# Patient Record
Sex: Female | Born: 1995 | Race: Black or African American | Hispanic: No | Marital: Single | Smoking: Never smoker
Health system: Southern US, Community
[De-identification: ages and names within clinical notes are randomized; demographics above are authoritative.]

## PROBLEM LIST (undated history)

## (undated) ENCOUNTER — Ambulatory Visit (HOSPITAL_COMMUNITY): Admission: EM | Discharge status: Absent without leave | Payer: No Typology Code available for payment source

## (undated) DIAGNOSIS — B9689 Other specified bacterial agents as the cause of diseases classified elsewhere: Secondary | ICD-10-CM

## (undated) DIAGNOSIS — N83209 Unspecified ovarian cyst, unspecified side: Secondary | ICD-10-CM

## (undated) HISTORY — PX: NO PAST SURGERIES: SHX2092

---
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
## 2014-02-22 ENCOUNTER — Emergency Department (HOSPITAL_COMMUNITY): Payer: BC Managed Care – PPO

## 2014-02-22 ENCOUNTER — Emergency Department (HOSPITAL_COMMUNITY)
Admission: EM | Admit: 2014-02-22 | Discharge: 2014-02-22 | Disposition: A | Discharge status: Home / Self Care | Payer: BC Managed Care – PPO | Attending: Emergency Medicine | Admitting: Emergency Medicine

## 2014-02-22 ENCOUNTER — Encounter (HOSPITAL_COMMUNITY): Payer: Self-pay | Admitting: Emergency Medicine

## 2014-02-22 DIAGNOSIS — R079 Chest pain, unspecified: Secondary | ICD-10-CM | POA: Diagnosis present

## 2014-02-22 DIAGNOSIS — R339 Retention of urine, unspecified: Secondary | ICD-10-CM

## 2014-02-22 DIAGNOSIS — F17200 Nicotine dependence, unspecified, uncomplicated: Secondary | ICD-10-CM | POA: Insufficient documentation

## 2014-02-22 DIAGNOSIS — R112 Nausea with vomiting, unspecified: Secondary | ICD-10-CM

## 2014-02-22 DIAGNOSIS — Z792 Long term (current) use of antibiotics: Secondary | ICD-10-CM | POA: Insufficient documentation

## 2014-02-22 DIAGNOSIS — N390 Urinary tract infection, site not specified: Secondary | ICD-10-CM | POA: Diagnosis not present

## 2014-02-22 DIAGNOSIS — R0789 Other chest pain: Secondary | ICD-10-CM

## 2014-02-22 DIAGNOSIS — Z3202 Encounter for pregnancy test, result negative: Secondary | ICD-10-CM | POA: Diagnosis not present

## 2014-02-22 LAB — COMPREHENSIVE METABOLIC PANEL
ALT: 15 U/L (ref 0–35)
AST: 22 U/L (ref 0–37)
Albumin: 4.4 g/dL (ref 3.5–5.2)
Alkaline Phosphatase: 73 U/L (ref 39–117)
BILIRUBIN TOTAL: 0.4 mg/dL (ref 0.3–1.2)
BUN: 9 mg/dL (ref 6–23)
CHLORIDE: 101 meq/L (ref 96–112)
CO2: 20 meq/L (ref 19–32)
CREATININE: 0.66 mg/dL (ref 0.50–1.10)
Calcium: 10 mg/dL (ref 8.4–10.5)
GFR calc Af Amer: >90 mL/min (ref >90)
Glucose, Bld: 123 mg/dL — ABNORMAL HIGH (ref 70–99)
Potassium: 4.8 mEq/L (ref 3.7–5.3)
Sodium: 138 mEq/L (ref 137–147)
Total Protein: 8.4 g/dL — ABNORMAL HIGH (ref 6.0–8.3)

## 2014-02-22 LAB — CBC WITH DIFFERENTIAL/PLATELET
BASOS ABS: 0 10*3/uL (ref 0.0–0.1)
Basophils Relative: 0 % (ref 0–1)
Eosinophils Absolute: 0 10*3/uL (ref 0.0–0.7)
Eosinophils Relative: 0 % (ref 0–5)
HEMATOCRIT: 42.1 % (ref 36.0–46.0)
HEMOGLOBIN: 14.1 g/dL (ref 12.0–15.0)
LYMPHS ABS: 1.4 10*3/uL (ref 0.7–4.0)
LYMPHS PCT: 15 % (ref 12–46)
MCH: 25.3 pg — ABNORMAL LOW (ref 26.0–34.0)
MCHC: 33.5 g/dL (ref 30.0–36.0)
MCV: 75.4 fL — ABNORMAL LOW (ref 78.0–100.0)
MONO ABS: 0.2 10*3/uL (ref 0.1–1.0)
MONOS PCT: 2 % — AB (ref 3–12)
NEUTROS ABS: 7.7 10*3/uL (ref 1.7–7.7)
Neutrophils Relative %: 83 % — ABNORMAL HIGH (ref 43–77)
Platelets: 270 10*3/uL (ref 150–400)
RBC: 5.58 MIL/uL — AB (ref 3.87–5.11)
RDW: 13.9 % (ref 11.5–15.5)
WBC: 9.4 10*3/uL (ref 4.0–10.5)

## 2014-02-22 LAB — URINALYSIS, ROUTINE W REFLEX MICROSCOPIC
Bilirubin Urine: NEGATIVE
Bilirubin Urine: NEGATIVE
GLUCOSE, UA: NEGATIVE mg/dL
GLUCOSE, UA: NEGATIVE mg/dL
KETONES UR: 15 mg/dL — AB
Ketones, ur: 40 mg/dL — AB
Nitrite: NEGATIVE
Nitrite: NEGATIVE
PROTEIN: 100 mg/dL — AB
Protein, ur: 100 mg/dL — AB
SPECIFIC GRAVITY, URINE: 1.025 (ref 1.005–1.030)
Specific Gravity, Urine: 1.023 (ref 1.005–1.030)
Urobilinogen, UA: 1 mg/dL (ref 0.0–1.0)
Urobilinogen, UA: 1 mg/dL (ref 0.0–1.0)
pH: 8.5 — ABNORMAL HIGH (ref 5.0–8.0)
pH: 8.5 — ABNORMAL HIGH (ref 5.0–8.0)

## 2014-02-22 LAB — URINE MICROSCOPIC-ADD ON

## 2014-02-22 LAB — LIPASE, BLOOD: Lipase: 16 U/L (ref 11–59)

## 2014-02-22 LAB — PREGNANCY, URINE: Preg Test, Ur: NEGATIVE

## 2014-02-22 MED ORDER — ONDANSETRON HCL 4 MG/2ML IJ SOLN
4.0000 mg | Freq: Once | INTRAMUSCULAR | Status: AC
  Administered 2014-02-22: 4 mg via INTRAVENOUS
  Filled 2014-02-22: qty 2

## 2014-02-22 MED ORDER — SODIUM CHLORIDE 0.9 % IV BOLUS (SEPSIS)
1000.0000 mL | Freq: Once | INTRAVENOUS | Status: AC
  Administered 2014-02-22: 1000 mL via INTRAVENOUS

## 2014-02-22 MED ORDER — DEXTROSE 5 % IV SOLN
1.0000 g | Freq: Once | INTRAVENOUS | Status: AC
  Administered 2014-02-22: 1 g via INTRAVENOUS
  Filled 2014-02-22: qty 10

## 2014-02-22 MED ORDER — OXYCODONE-ACETAMINOPHEN 5-325 MG PO TABS
1.0000 | ORAL_TABLET | Freq: Once | ORAL | Status: AC
  Administered 2014-02-22: 1 via ORAL
  Filled 2014-02-22: qty 1

## 2014-02-22 MED ORDER — CEPHALEXIN 500 MG PO CAPS: 500.0000 mg | ORAL_CAPSULE | Freq: Four times a day (QID) | ORAL | Status: DC

## 2014-02-22 MED ORDER — FENTANYL CITRATE 0.05 MG/ML IJ SOLN
100.0000 ug | Freq: Once | INTRAMUSCULAR | Status: AC
  Administered 2014-02-22: 100 ug via INTRAVENOUS
  Filled 2014-02-22: qty 2

## 2014-02-22 MED ORDER — SODIUM CHLORIDE 0.9 % IV BOLUS (SEPSIS)
500.0000 mL | Freq: Once | INTRAVENOUS | Status: AC
  Administered 2014-02-22: 500 mL via INTRAVENOUS

## 2014-02-22 MED ORDER — ONDANSETRON 4 MG PO TBDP
8.0000 mg | ORAL_TABLET | Freq: Once | ORAL | Status: AC
  Administered 2014-02-22: 8 mg via ORAL
  Filled 2014-02-22: qty 2

## 2014-02-22 NOTE — ED Notes (Signed)
The pt was seen here earlier today  And diagnosed with a uti and she was having nv and chest pain which sh still has at present.  She has not beenable to hold her meds down since she got home,  lmp  May 30

## 2014-02-22 NOTE — ED Provider Notes (Signed)
Medical screening examination/treatment/procedure(s) were performed by non-physician practitioner and as supervising physician I was immediately available for consultation/collaboration.   EKG Interpretation   Date/Time:  Thursday February 22 2014 11:32:29 EDT Ventricular Rate:  80 PR Interval:  118 QRS Duration: 79 QT Interval:  354 QTC Calculation: 408 R Axis:   61 Text Interpretation:  Sinus rhythm Borderline short PR interval Confirmed  by WARD,  DO, KRISTEN [phone]) on 02/22/2014 11:36:48 AM        Layla Maw Ward, DO 02/22/14 [id number]

## 2014-02-22 NOTE — ED Provider Notes (Signed)
CSN: [id number]     Arrival date & time 02/22/14  1115 History   First MD Initiated Contact with Patient 02/22/14 1117     Chief Complaint  Patient presents with  . Chest Pain     (Consider location/radiation/quality/duration/timing/severity/associated sxs/prior Treatment) HPI  Patient to the ER with complaints of not feeling well, chest pains, abdominal pain and vomiting. The symptoms started yesterday with RLQ abdominal pains and vomiting but has since resolved. She also complains of some pressure during urination. Then today she developed pain in her chest, she is unable to describe it more than a pressure feeling all over and that the pain is severe. Patient looks as though she does not feel well and talks with her eyes closed. She has normal vital signs and her EKG is also normal.  She reports NO pain at all today in her abdomen. No diarrhea, SOB, cough, vaginal bleeding or discharge. LMP was last month.  History reviewed. No pertinent past medical history. History reviewed. No pertinent past surgical history. No family history on file. History  Substance Use Topics  . Smoking status: Current Some Day Smoker    Types: Cigarettes  . Smokeless tobacco: Not on file  . Alcohol Use: No   OB History   Grav Para Term Preterm Abortions TAB SAB Ect Mult Living                 Review of Systems  Review of Systems  Gen: no weight loss, fevers, chills, night sweats  Eyes: no discharge or drainage, no occular pain or visual changes  Nose: no epistaxis or rhinorrhea  Mouth: no dental pain, no sore throat  Neck: no neck pain  Lungs:No wheezing, coughing or hemoptysis CV: + chest pain,No  palpitations, dependent edema or orthopnea  Abd: + abdominal pain, nausea, vomiting, No diarrhea GU: no dysuria or gross hematuria  MSK:  No muscle weakness or pain Neuro: no headache, no focal neurologic deficits  Skin: no rash or wounds Psyche: no complaints     Allergies  Review of  patient's allergies indicates no known allergies.  Home Medications   Prior to Admission medications   Medication Sig Start Date End Date Taking? Authorizing Provider  ibuprofen (ADVIL,MOTRIN) 200 MG tablet Take 400 mg by mouth every 6 (six) hours as needed.   Yes Historical Provider, MD  cephALEXin (KEFLEX) 500 MG capsule Take 1 capsule (500 mg total) by mouth 4 (four) times daily. 02/22/14   Olancha Ducre Irine Seal, PA-C   BP 107/58  Pulse 79  Temp(Src) 98.5 F (36.9 C) (Oral)  Resp 99  Ht 5\' 4"  (1.626 m)  Wt 145 lb (65.772 kg)  BMI 24.88 kg/m2  SpO2 100%  LMP 01/22/2014 Physical Exam  Nursing note and vitals reviewed. Constitutional: She appears well-developed and well-nourished. No distress.  HENT:  Head: Normocephalic and atraumatic.  Eyes: Pupils are equal, round, and reactive to light.  Neck: Normal range of motion. Neck supple.  Cardiovascular: Normal rate and regular rhythm.   Pulmonary/Chest: Effort normal and breath sounds normal. She has no decreased breath sounds. She has no wheezes. She has no rhonchi.  No tenderness to chest wall.  Abdominal: Soft. Bowel sounds are normal. She exhibits no distension. There is no tenderness. There is no rigidity, no guarding, no CVA tenderness and negative Murphy's sign.  No tenderness to the abdomen.  Neurological: She is alert.  Skin: Skin is warm and dry.      ED Course  Procedures (  including critical care time) Labs Review Labs Reviewed  CBC WITH DIFFERENTIAL - Abnormal; Notable for the following:    RBC 5.58 (*)    MCV 75.4 (*)    MCH 25.3 (*)    Neutrophils Relative % 83 (*)    Monocytes Relative 2 (*)    All other components within normal limits  COMPREHENSIVE METABOLIC PANEL - Abnormal; Notable for the following:    Glucose, Bld 123 (*)    Total Protein 8.4 (*)    All other components within normal limits  URINALYSIS, ROUTINE W REFLEX MICROSCOPIC - Abnormal; Notable for the following:    APPearance CLOUDY (*)    pH  8.5 (*)    Hgb urine dipstick MODERATE (*)    Ketones, ur 15 (*)    Protein, ur 100 (*)    Leukocytes, UA LARGE (*)    All other components within normal limits  URINE MICROSCOPIC-ADD ON - Abnormal; Notable for the following:    Squamous Epithelial / LPF MANY (*)    Bacteria, UA MANY (*)    All other components within normal limits  URINALYSIS, ROUTINE W REFLEX MICROSCOPIC - Abnormal; Notable for the following:    APPearance CLOUDY (*)    pH 8.5 (*)    Hgb urine dipstick MODERATE (*)    Ketones, ur 40 (*)    Protein, ur 100 (*)    Leukocytes, UA LARGE (*)    All other components within normal limits  URINE MICROSCOPIC-ADD ON - Abnormal; Notable for the following:    Squamous Epithelial / LPF MANY (*)    Bacteria, UA MANY (*)    All other components within normal limits  URINE CULTURE  LIPASE, BLOOD  PREGNANCY, URINE    Imaging Review Dg Chest 2 View  02/22/2014   CLINICAL DATA:  Chest pain.  EXAM: CHEST  2 VIEW  COMPARISON:  07/02/2012.  FINDINGS: The heart size and mediastinal contours are within normal limits. Both lungs are clear. The visualized skeletal structures are unremarkable.  IMPRESSION: Normal chest x-ray.   Electronically Signed   By: Loralie Champagne M.D.   On: 02/22/2014 12:43     EKG Interpretation   Date/Time:  Thursday February 22 2014 11:32:29 EDT Ventricular Rate:  80 PR Interval:  118 QRS Duration: 79 QT Interval:  354 QTC Calculation: 408 R Axis:   61 Text Interpretation:  Sinus rhythm Borderline short PR interval Confirmed  by WARD,  DO, KRISTEN ([id number]) on 02/22/2014 11:36:48 AM      MDM   Final diagnoses:  UTI (lower urinary tract infection)    The patients urine has come back with a pH of 8.5, ketones, moderate hemoglobin and large leukocytes. Microscopic showed many squamous cells and bacteria. Due to her having some abdominal pain earlier and some vague other pain symptoms I have asked the nurse to repeat her urinalysis and give education on  clean-catch.  Patient given 1 L of saline and some pain medications.  1:39 pm - Urine is still pending but patient looks much much better. Now that she has had a liter of fluids she is no longer having pain and no longer feels tired. Her mom is now present and admits that she hasnt been eating and drinking as much because she wasn't feeling well, this caused her to get worse.   2:53 pm- patient received 1 gram Iv Rocephin due to the fact that she was sleepy, had abd pain and vomiting yesterday. Will switch to oral  keflex, Rx for Keflex written to treat for UTI. She can follow-up with her PCP.  The urine culture has been added on.  17 y.o.Kayla Massey's evaluation in the Emergency Department is complete. It has been determined that no acute conditions requiring further emergency intervention are present at this time. The patient/guardian have been advised of the diagnosis and plan. We have discussed signs and symptoms that warrant return to the ED, such as changes or worsening in symptoms.  Vital signs are stable at discharge. Filed Vitals:   02/22/14 1322  BP: 107/58  Pulse:   Temp: 98.5 F (36.9 C)  Resp: 99    Patient/guardian has voiced understanding and agreed to follow-up with the PCP or specialist.   Dorthula Matasiffany G Destin Kittler, PA-C 02/22/14 1455

## 2014-02-22 NOTE — Discharge Instructions (Signed)
Urinary Tract Infection  Urinary tract infections (UTIs) can develop anywhere along your urinary tract. Your urinary tract is your body's drainage system for removing wastes and extra water. Your urinary tract includes two kidneys, two ureters, a bladder, and a urethra. Your kidneys are a pair of bean-shaped organs. Each kidney is about the size of your fist. They are located below your ribs, one on each side of your spine.  CAUSES  Infections are caused by microbes, which are microscopic organisms, including fungi, viruses, and bacteria. These organisms are so small that they can only be seen through a microscope. Bacteria are the microbes that most commonly cause UTIs.  SYMPTOMS   Symptoms of UTIs may vary by age and gender of the patient and by the location of the infection. Symptoms in young women typically include a frequent and intense urge to urinate and a painful, burning feeling in the bladder or urethra during urination. Older women and men are more likely to be tired, shaky, and weak and have muscle aches and abdominal pain. A fever may mean the infection is in your kidneys. Other symptoms of a kidney infection include pain in your back or sides below the ribs, nausea, and vomiting.  DIAGNOSIS  To diagnose a UTI, your caregiver will ask you about your symptoms. Your caregiver also will ask to provide a urine sample. The urine sample will be tested for bacteria and white blood cells. White blood cells are made by your body to help fight infection.  TREATMENT   Typically, UTIs can be treated with medication. Because most UTIs are caused by a bacterial infection, they usually can be treated with the use of antibiotics. The choice of antibiotic and length of treatment depend on your symptoms and the type of bacteria causing your infection.  HOME CARE INSTRUCTIONS   If you were prescribed antibiotics, take them exactly as your caregiver instructs you. Finish the medication even if you feel better after you  have only taken some of the medication.   Drink enough water and fluids to keep your urine clear or pale yellow.   Avoid caffeine, tea, and carbonated beverages. They tend to irritate your bladder.   Empty your bladder often. Avoid holding urine for long periods of time.   Empty your bladder before and after sexual intercourse.   After a bowel movement, women should cleanse from front to back. Use each tissue only once.  SEEK MEDICAL CARE IF:    You have back pain.   You develop a fever.   Your symptoms do not begin to resolve within 3 days.  SEEK IMMEDIATE MEDICAL CARE IF:    You have severe back pain or lower abdominal pain.   You develop chills.   You have nausea or vomiting.   You have continued burning or discomfort with urination.  MAKE SURE YOU:    Understand these instructions.   Will watch your condition.   Will get help right away if you are not doing well or get worse.  Document Released: 06/10/2005 Document Revised: 03/01/2012 Document Reviewed: 10/09/2011  [employer] Patient Information 2014 [employer]

## 2014-02-23 ENCOUNTER — Emergency Department (HOSPITAL_COMMUNITY)
Admission: EM | Admit: 2014-02-23 | Discharge: 2014-02-23 | Disposition: A | Discharge status: Home / Self Care | Payer: BC Managed Care – PPO | Source: Home / Self Care | Attending: Emergency Medicine | Admitting: Emergency Medicine

## 2014-02-23 DIAGNOSIS — R0789 Other chest pain: Secondary | ICD-10-CM

## 2014-02-23 DIAGNOSIS — R112 Nausea with vomiting, unspecified: Secondary | ICD-10-CM

## 2014-02-23 DIAGNOSIS — R339 Retention of urine, unspecified: Secondary | ICD-10-CM

## 2014-02-23 DIAGNOSIS — N390 Urinary tract infection, site not specified: Secondary | ICD-10-CM | POA: Diagnosis not present

## 2014-02-23 LAB — URINALYSIS, ROUTINE W REFLEX MICROSCOPIC
BILIRUBIN URINE: NEGATIVE
Glucose, UA: NEGATIVE mg/dL
Ketones, ur: >80 mg/dL — AB
NITRITE: NEGATIVE
PROTEIN: 100 mg/dL — AB
SPECIFIC GRAVITY, URINE: 1.027 (ref 1.005–1.030)
UROBILINOGEN UA: 1 mg/dL (ref 0.0–1.0)
pH: 7 (ref 5.0–8.0)

## 2014-02-23 LAB — CBC WITH DIFFERENTIAL/PLATELET
BASOS ABS: 0 10*3/uL (ref 0.0–0.1)
BASOS PCT: 0 % (ref 0–1)
EOS ABS: 0 10*3/uL (ref 0.0–0.7)
Eosinophils Relative: 0 % (ref 0–5)
HEMATOCRIT: 40.1 % (ref 36.0–46.0)
Hemoglobin: 13.1 g/dL (ref 12.0–15.0)
Lymphocytes Relative: 20 % (ref 12–46)
Lymphs Abs: 1.9 10*3/uL (ref 0.7–4.0)
MCH: 24.9 pg — AB (ref 26.0–34.0)
MCHC: 32.7 g/dL (ref 30.0–36.0)
MCV: 76.2 fL — ABNORMAL LOW (ref 78.0–100.0)
MONO ABS: 0.6 10*3/uL (ref 0.1–1.0)
Monocytes Relative: 6 % (ref 3–12)
NEUTROS ABS: 7 10*3/uL (ref 1.7–7.7)
NEUTROS PCT: 74 % (ref 43–77)
Platelets: 248 10*3/uL (ref 150–400)
RBC: 5.26 MIL/uL — ABNORMAL HIGH (ref 3.87–5.11)
RDW: 14.1 % (ref 11.5–15.5)
WBC: 9.6 10*3/uL (ref 4.0–10.5)

## 2014-02-23 LAB — URINE MICROSCOPIC-ADD ON

## 2014-02-23 MED ORDER — ONDANSETRON HCL 4 MG PO TABS: 4.0000 mg | ORAL_TABLET | Freq: Four times a day (QID) | ORAL | Status: DC | PRN

## 2014-02-23 MED ORDER — SODIUM CHLORIDE 0.9 % IV SOLN
1000.0000 mL | Freq: Once | INTRAVENOUS | Status: AC
  Administered 2014-02-23: 1000 mL via INTRAVENOUS

## 2014-02-23 MED ORDER — ONDANSETRON HCL 4 MG/2ML IJ SOLN
4.0000 mg | Freq: Once | INTRAMUSCULAR | Status: AC
  Administered 2014-02-23: 4 mg via INTRAVENOUS
  Filled 2014-02-23: qty 2

## 2014-02-23 MED ORDER — SODIUM CHLORIDE 0.9 % IV SOLN: 1000.0000 mL | INTRAVENOUS | Status: DC

## 2014-02-23 MED ORDER — KETOROLAC TROMETHAMINE 30 MG/ML IJ SOLN
30.0000 mg | Freq: Once | INTRAMUSCULAR | Status: AC
  Administered 2014-02-23: 30 mg via INTRAVENOUS
  Filled 2014-02-23: qty 1

## 2014-02-23 NOTE — ED Provider Notes (Signed)
CSN: [id number]     Arrival date & time 02/22/14  2105 History   First MD Initiated Contact with Patient 02/23/14 0342     Chief Complaint  Patient presents with  . diagnosed with uti earlier today      (Consider location/radiation/quality/duration/timing/severity/associated sxs/prior Treatment) The history is provided by the patient.   18 year old female had been in the ED yesterday and diagnosed with UTI. She presented with pain which was in her back and chest. She states pain was sharp and she rated pain at 10/10. She was seen in the ED and discharged with prescription for cephalexin. There was associated nausea and vomiting. She continued to have nausea and vomiting at home and was unable to get cephalexin down. Dyes she comes in with ongoing problems with pain in her chest. She denies fever, chills, sweats. She denies dysuria or urinary urgency or frequency. She had been given a dose of oxycodone-acetaminophen at triage and chest pain has resolved. She was also given a dose of ondansetron but she continues to have some nausea.  History reviewed. No pertinent past medical history. History reviewed. No pertinent past surgical history. No family history on file. History  Substance Use Topics  . Smoking status: Current Some Day Smoker    Types: Cigarettes  . Smokeless tobacco: Not on file  . Alcohol Use: No   OB History   Grav Para Term Preterm Abortions TAB SAB Ect Mult Living                 Review of Systems  All other systems reviewed and are negative.     Allergies  Review of patient's allergies indicates no known allergies.  Home Medications   Prior to Admission medications   Medication Sig Start Date End Date Taking? Authorizing Provider  cephALEXin (KEFLEX) 500 MG capsule Take 1 capsule (500 mg total) by mouth 4 (four) times daily. 02/22/14  Yes Tiffany Irine SealG Greene, PA-C  ibuprofen (ADVIL,MOTRIN) 200 MG tablet Take 400 mg by mouth every 6 (six) hours as needed.   Yes  Historical Provider, MD   BP 96/61  Pulse 68  Temp(Src) 99.4 F (37.4 C) (Oral)  Resp 18  Ht 5\' 4"  (1.626 m)  Wt 145 lb (65.772 kg)  BMI 24.88 kg/m2  SpO2 98%  LMP 01/22/2014 Physical Exam  Nursing note and vitals reviewed.  18 year old female, resting comfortably and in no acute distress. Vital signs are normal. Oxygen saturation is 98%, which is normal. Head is normocephalic and atraumatic. PERRLA, EOMI. Oropharynx is clear. Neck is nontender and supple without adenopathy or JVD. Back is nontender and there is no CVA tenderness. Lungs are clear without rales, wheezes, or rhonchi. Chest is nontender. Heart has regular rate and rhythm without murmur. Abdomen is soft, flat, nontender without masses or hepatosplenomegaly and peristalsis is hypoactive. Extremities have no cyanosis or edema, full range of motion is present. Skin is warm and dry without rash. Neurologic: Mental status is normal, cranial nerves are intact, there are no motor or sensory deficits.  ED Course  Procedures (including critical care time) Labs Review Results for orders placed during the hospital encounter of 02/23/14  CBC WITH DIFFERENTIAL      Result Value Ref Range   WBC 9.6  4.0 - 10.5 K/uL   RBC 5.26 (*) 3.87 - 5.11 MIL/uL   Hemoglobin 13.1  12.0 - 15.0 g/dL   HCT 40.940.1  81.136.0 - 91.446.0 %   MCV 76.2 (*) 78.0 -  100.0 fL   MCH 24.9 (*) 26.0 - 34.0 pg   MCHC 32.7  30.0 - 36.0 g/dL   RDW 16.114.1  09.611.5 - 04.515.5 %   Platelets 248  150 - 400 K/uL   Neutrophils Relative % 74  43 - 77 %   Neutro Abs 7.0  1.7 - 7.7 K/uL   Lymphocytes Relative 20  12 - 46 %   Lymphs Abs 1.9  0.7 - 4.0 K/uL   Monocytes Relative 6  3 - 12 %   Monocytes Absolute 0.6  0.1 - 1.0 K/uL   Eosinophils Relative 0  0 - 5 %   Eosinophils Absolute 0.0  0.0 - 0.7 K/uL   Basophils Relative 0  0 - 1 %   Basophils Absolute 0.0  0.0 - 0.1 K/uL  URINALYSIS, ROUTINE W REFLEX MICROSCOPIC      Result Value Ref Range   Color, Urine YELLOW  YELLOW    APPearance TURBID (*) CLEAR   Specific Gravity, Urine 1.027  1.005 - 1.030   pH 7.0  5.0 - 8.0   Glucose, UA NEGATIVE  NEGATIVE mg/dL   Hgb urine dipstick LARGE (*) NEGATIVE   Bilirubin Urine NEGATIVE  NEGATIVE   Ketones, ur >80 (*) NEGATIVE mg/dL   Protein, ur 409100 (*) NEGATIVE mg/dL   Urobilinogen, UA 1.0  0.0 - 1.0 mg/dL   Nitrite NEGATIVE  NEGATIVE   Leukocytes, UA LARGE (*) NEGATIVE  URINE MICROSCOPIC-ADD ON      Result Value Ref Range   Squamous Epithelial / LPF RARE  RARE   WBC, UA TOO NUMEROUS TO COUNT  <3 WBC/hpf   RBC / HPF TOO NUMEROUS TO COUNT  <3 RBC/hpf   Bacteria, UA FEW (*) RARE    MDM   Final diagnoses:  Urinary retention with incomplete bladder emptying  Nausea & vomiting  Non-cardiac chest pain    Vague chest pain and nausea. Pain is resolved and he has been more than 5 hours since her dose of oxycodone acetaminophen so I do not feel that pain is has been asked. She continues to have nausea. Old records are reviewed and she had 2 urinalyses done, both of which were contaminated. She was given a dose of ceftriaxone so she has received adequate treatment for a UTI. She will be given intravenous ondansetron and dose of ketorolac. I am not all that certain that she actually has a UTI.  Catheterized urinalysis she confirms presence of UTI with too numerous to count WBCs and too numerous to count RBCs. WBC has remained stable. She feels much better after IV fluids, ondansetron, and ketorolac. She is discharged with prescription for ondansetron and is advised to use either ibuprofen or naproxen as needed for pain. She is to continue taking cephalexin. She will need to follow up with PCP in 10 days to make sure that urine has cleared.  Dione Boozeavid Saarah Dewing, MD 02/23/14 [phone]

## 2014-02-23 NOTE — Discharge Instructions (Signed)
Continue taking the antibiotic. Take Ibuprofen or Naproxen as needed for pain.  Urinary Tract Infection Urinary tract infections (UTIs) can develop anywhere along your urinary tract. Your urinary tract is your body's drainage system for removing wastes and extra water. Your urinary tract includes two kidneys, two ureters, a bladder, and a urethra. Your kidneys are a pair of bean-shaped organs. Each kidney is about the size of your fist. They are located below your ribs, one on each side of your spine. CAUSES Infections are caused by microbes, which are microscopic organisms, including fungi, viruses, and bacteria. These organisms are so small that they can only be seen through a microscope. Bacteria are the microbes that most commonly cause UTIs. SYMPTOMS  Symptoms of UTIs may vary by age and gender of the patient and by the location of the infection. Symptoms in young women typically include a frequent and intense urge to urinate and a painful, burning feeling in the bladder or urethra during urination. Older women and men are more likely to be tired, shaky, and weak and have muscle aches and abdominal pain. A fever may mean the infection is in your kidneys. Other symptoms of a kidney infection include pain in your back or sides below the ribs, nausea, and vomiting. DIAGNOSIS To diagnose a UTI, your caregiver will ask you about your symptoms. Your caregiver also will ask to provide a urine sample. The urine sample will be tested for bacteria and white blood cells. White blood cells are made by your body to help fight infection. TREATMENT  Typically, UTIs can be treated with medication. Because most UTIs are caused by a bacterial infection, they usually can be treated with the use of antibiotics. The choice of antibiotic and length of treatment depend on your symptoms and the type of bacteria causing your infection. HOME CARE INSTRUCTIONS  If you were prescribed antibiotics, take them exactly as your  caregiver instructs you. Finish the medication even if you feel better after you have only taken some of the medication.  Drink enough water and fluids to keep your urine clear or pale yellow.  Avoid caffeine, tea, and carbonated beverages. They tend to irritate your bladder.  Empty your bladder often. Avoid holding urine for long periods of time.  Empty your bladder before and after sexual intercourse.  After a bowel movement, women should cleanse from front to back. Use each tissue only once. SEEK MEDICAL CARE IF:   You have back pain.  You develop a fever.  Your symptoms do not begin to resolve within 3 days. SEEK IMMEDIATE MEDICAL CARE IF:   You have severe back pain or lower abdominal pain.  You develop chills.  You have nausea or vomiting.  You have continued burning or discomfort with urination. MAKE SURE YOU:   Understand these instructions.  Will watch your condition.  Will get help right away if you are not doing well or get worse. Document Released: 06/10/2005 Document Revised: 03/01/2012 Document Reviewed: 10/09/2011 [facility] Patient Information 2014 [employer], [place].  Nausea and Vomiting Nausea is a sick feeling that often comes before throwing up (vomiting). Vomiting is a reflex where stomach contents come out of your mouth. Vomiting can cause severe loss of body fluids (dehydration). Children and elderly adults can become dehydrated quickly, especially if they also have diarrhea. Nausea and vomiting are symptoms of a condition or disease. It is important to find the cause of your symptoms. CAUSES   Direct irritation of the stomach lining. This irritation can  result from increased acid production (gastroesophageal reflux disease), infection, food poisoning, taking certain medicines (such as nonsteroidal anti-inflammatory drugs), alcohol use, or tobacco use.  Signals from the brain.These signals could be caused by a headache, heat exposure, an inner ear  disturbance, increased pressure in the brain from injury, infection, a tumor, or a concussion, pain, emotional stimulus, or metabolic problems.  An obstruction in the gastrointestinal tract (bowel obstruction).  Illnesses such as diabetes, hepatitis, gallbladder problems, appendicitis, kidney problems, cancer, sepsis, atypical symptoms of a heart attack, or eating disorders.  Medical treatments such as chemotherapy and radiation.  Receiving medicine that makes you sleep (general anesthetic) during surgery. DIAGNOSIS Your caregiver may ask for tests to be done if the problems do not improve after a few days. Tests may also be done if symptoms are severe or if the reason for the nausea and vomiting is not clear. Tests may include:  Urine tests.  Blood tests.  Stool tests.  Cultures (to look for evidence of infection).  X-rays or other imaging studies. Test results can help your caregiver make decisions about treatment or the need for additional tests. TREATMENT You need to stay well hydrated. Drink frequently but in small amounts.You may wish to drink water, sports drinks, clear broth, or eat frozen ice pops or gelatin dessert to help stay hydrated.When you eat, eating slowly may help prevent nausea.There are also some antinausea medicines that may help prevent nausea. HOME CARE INSTRUCTIONS   Take all medicine as directed by your caregiver.  If you do not have an appetite, do not force yourself to eat. However, you must continue to drink fluids.  If you have an appetite, eat a normal diet unless your caregiver tells you differently.  Eat a variety of complex carbohydrates (rice, wheat, potatoes, bread), lean meats, yogurt, fruits, and vegetables.  Avoid high-fat foods because they are more difficult to digest.  Drink enough water and fluids to keep your urine clear or pale yellow.  If you are dehydrated, ask your caregiver for specific rehydration instructions. Signs of  dehydration may include:  Severe thirst.  Dry lips and mouth.  Dizziness.  Dark urine.  Decreasing urine frequency and amount.  Confusion.  Rapid breathing or pulse. SEEK IMMEDIATE MEDICAL CARE IF:   You have blood or brown flecks (like coffee grounds) in your vomit.  You have black or bloody stools.  You have a severe headache or stiff neck.  You are confused.  You have severe abdominal pain.  You have chest pain or trouble breathing.  You do not urinate at least once every 8 hours.  You develop cold or clammy skin.  You continue to vomit for longer than 24 to 48 hours.  You have a fever. MAKE SURE YOU:   Understand these instructions.  Will watch your condition.  Will get help right away if you are not doing well or get worse. Document Released: 08/31/2005 Document Revised: 11/23/2011 Document Reviewed: 01/28/2011 [facility] Patient Information 2014 [place], [place].  Ondansetron tablets What is this medicine? ONDANSETRON (on DAN se tron) is used to treat nausea and vomiting caused by chemotherapy. It is also used to prevent or treat nausea and vomiting after surgery. This medicine may be used for other purposes; ask your health care provider or pharmacist if you have questions. COMMON BRAND NAME(S): Zofran What should I tell my health care provider before I take this medicine? They need to know if you have any of these conditions: -heart disease -history of  irregular heartbeat -liver disease -low levels of magnesium or potassium in the blood -an unusual or allergic reaction to ondansetron, granisetron, other medicines, foods, dyes, or preservatives -pregnant or trying to get pregnant -breast-feeding How should I use this medicine? Take this medicine by mouth with a glass of water. Follow the directions on your prescription label. Take your doses at regular intervals. Do not take your medicine more often than directed. Talk to your pediatrician regarding  the use of this medicine in children. Special care may be needed. Overdosage: If you think you have taken too much of this medicine contact a poison control center or emergency room at once. NOTE: This medicine is only for you. Do not share this medicine with others. What if I miss a dose? If you miss a dose, take it as soon as you can. If it is almost time for your next dose, take only that dose. Do not take double or extra doses. What may interact with this medicine? Do not take this medicine with any of the following medications: -apomorphine -certain medicines for fungal infections like fluconazole, itraconazole, ketoconazole, posaconazole, voriconazole -cisapride -dofetilide -dronedarone -pimozide -thioridazine -ziprasidone  This medicine may also interact with the following medications: -carbamazepine -certain medicines for depression, anxiety, or psychotic disturbances -fentanyl -linezolid -MAOIs like Carbex, Eldepryl, Marplan, Nardil, and Parnate -methylene blue (injected into a vein) -other medicines that prolong the QT interval (cause an abnormal heart rhythm) -phenytoin -rifampicin -tramadol This list may not describe all possible interactions. Give your health care provider a list of all the medicines, herbs, non-prescription drugs, or dietary supplements you use. Also tell them if you smoke, drink alcohol, or use illegal drugs. Some items may interact with your medicine. What should I watch for while using this medicine? Check with your doctor or health care professional right away if you have any sign of an allergic reaction. What side effects may I notice from receiving this medicine? Side effects that you should report to your doctor or health care professional as soon as possible: -allergic reactions like skin rash, itching or hives, swelling of the face, lips or tongue -breathing problems -confusion -dizziness -fast or irregular heartbeat -feeling faint or  lightheaded, falls -fever and chills -loss of balance or coordination -seizures -sweating -swelling of the hands or feet -tightness in the chest -tremors -unusually weak or tired Side effects that usually do not require medical attention (report to your doctor or health care professional if they continue or are bothersome): -constipation or diarrhea -headache This list may not describe all possible side effects. Call your doctor for medical advice about side effects. You may report side effects to FDA at 1-800-FDA-1088. Where should I keep my medicine? Keep out of the reach of children. Store between 2 and 30 degrees C (36 and 86 degrees F). Throw away any unused medicine after the expiration date. NOTE: This sheet is a summary. It may not cover all possible information. If you have questions about this medicine, talk to your doctor, pharmacist, or health care provider.  2014, [employer]vier/[employer] Standard. (2013-06-07 16:27:45)

## 2014-02-24 LAB — URINE CULTURE

## 2014-02-25 ENCOUNTER — Telehealth (HOSPITAL_BASED_OUTPATIENT_CLINIC_OR_DEPARTMENT_OTHER): Payer: Self-pay | Admitting: Emergency Medicine

## 2014-02-25 NOTE — Telephone Encounter (Signed)
Post ED Visit - Positive Culture Follow-up: Chart Hand-off to ED Flow Manager  Culture assessed and recommendations reviewed by: []  Wes Dulaney, Pharm.D., BCPS []  Celedonio MiyamotoJeremy Frens, Pharm.D., BCPS [x]  Georgina PillionElizabeth Martin, Pharm.D., BCPS []  HelemanoMinh Pham, [address] Rainbow BoulevardPharm.D., BCPS, AAHIVP []  Estella HuskMichelle Turner, Pharm.D., BCPS, AAHIVP []  Harvie JuniorNathan Cope, Pharm.D.  Positive urine culture  []  Patient discharged without antimicrobial prescription and treatment is now indicated [x]  Organism is resistant to prescribed ED discharge antimicrobial []  Patient with positive blood cultures  Changes discussed with ED provider: Sharilyn SitesLisa Sanders PA-C New antibiotic prescription: Bactrim DS 1 tab BID x 7 days   Zeb ComfortHolland, Grove Defina 02/25/2014, 2:41 PM

## 2014-02-25 NOTE — Progress Notes (Signed)
ED Antimicrobial Stewardship Positive Culture Follow Up   Kayla Massey is an 18 y.o. female [facility] with a chief complaint of CP and RLQ abdominal pain  Chief Complaint  Patient presents with  . Chest Pain    Recent Results (from the past 720 hour(s))  URINE CULTURE     Status: None   Collection Time    02/22/14 12:59 PM      Result Value Ref Range Status   Specimen Description URINE, CATHETERIZED   Final   Special Requests NONE   Final   Culture  Setup Time     Final   Value: 02/22/2014 18:13     [facility]   Colony Count     Final   Value: >=100,000 COLONIES/ML     [facility]   Culture     Final   Value: STAPHYLOCOCCUS SPECIES (COAGULASE NEGATIVE)     Note: RIFAMPIN AND GENTAMICIN SHOULD NOT BE USED AS SINGLE DRUGS FOR TREATMENT OF STAPH INFECTIONS.     [facility]formed at [facility]   Report Status 02/24/2014 FINAL   Final   Organism ID, Bacteria STAPHYLOCOCCUS SPECIES (COAGULASE NEGATIVE)   Final    [x]  Treated with Keflex, organism resistant to prescribed antimicrobial  New antibiotic prescription: Bactrim DS 1 tab bid x 7 days  ED Provider: Sharilyn SitesLisa Sanders, PA-C  Rolley SimsMartin, Santiaga Butzin Ann 02/25/2014, 1:31 PM Infectious Diseases Pharmacist Phone# [phone]

## 2014-03-05 ENCOUNTER — Telehealth (HOSPITAL_BASED_OUTPATIENT_CLINIC_OR_DEPARTMENT_OTHER): Payer: Self-pay

## 2014-03-05 NOTE — Telephone Encounter (Signed)
Call x 2 attempted unsuccessfully.  [facility]

## 2014-03-09 ENCOUNTER — Telehealth (HOSPITAL_BASED_OUTPATIENT_CLINIC_OR_DEPARTMENT_OTHER): Payer: Self-pay | Admitting: Emergency Medicine

## 2014-03-09 NOTE — Telephone Encounter (Signed)
Pt called after receiving letter. ID verified x three. Notifiied of positive urine culture and need for change in antibiotic. [phone] voicemail.

## 2014-04-11 DIAGNOSIS — N926 Irregular menstruation, unspecified: Secondary | ICD-10-CM | POA: Insufficient documentation

## 2014-04-11 DIAGNOSIS — N946 Dysmenorrhea, unspecified: Secondary | ICD-10-CM | POA: Insufficient documentation

## 2015-01-06 IMAGING — CR DG CHEST 2V
2 series · 2 of 2 positions shown · non-contrast
Comparison: 07/02/2012.

CLINICAL DATA: Chest pain.

EXAM:
CHEST  2 VIEW

[w chest pa]
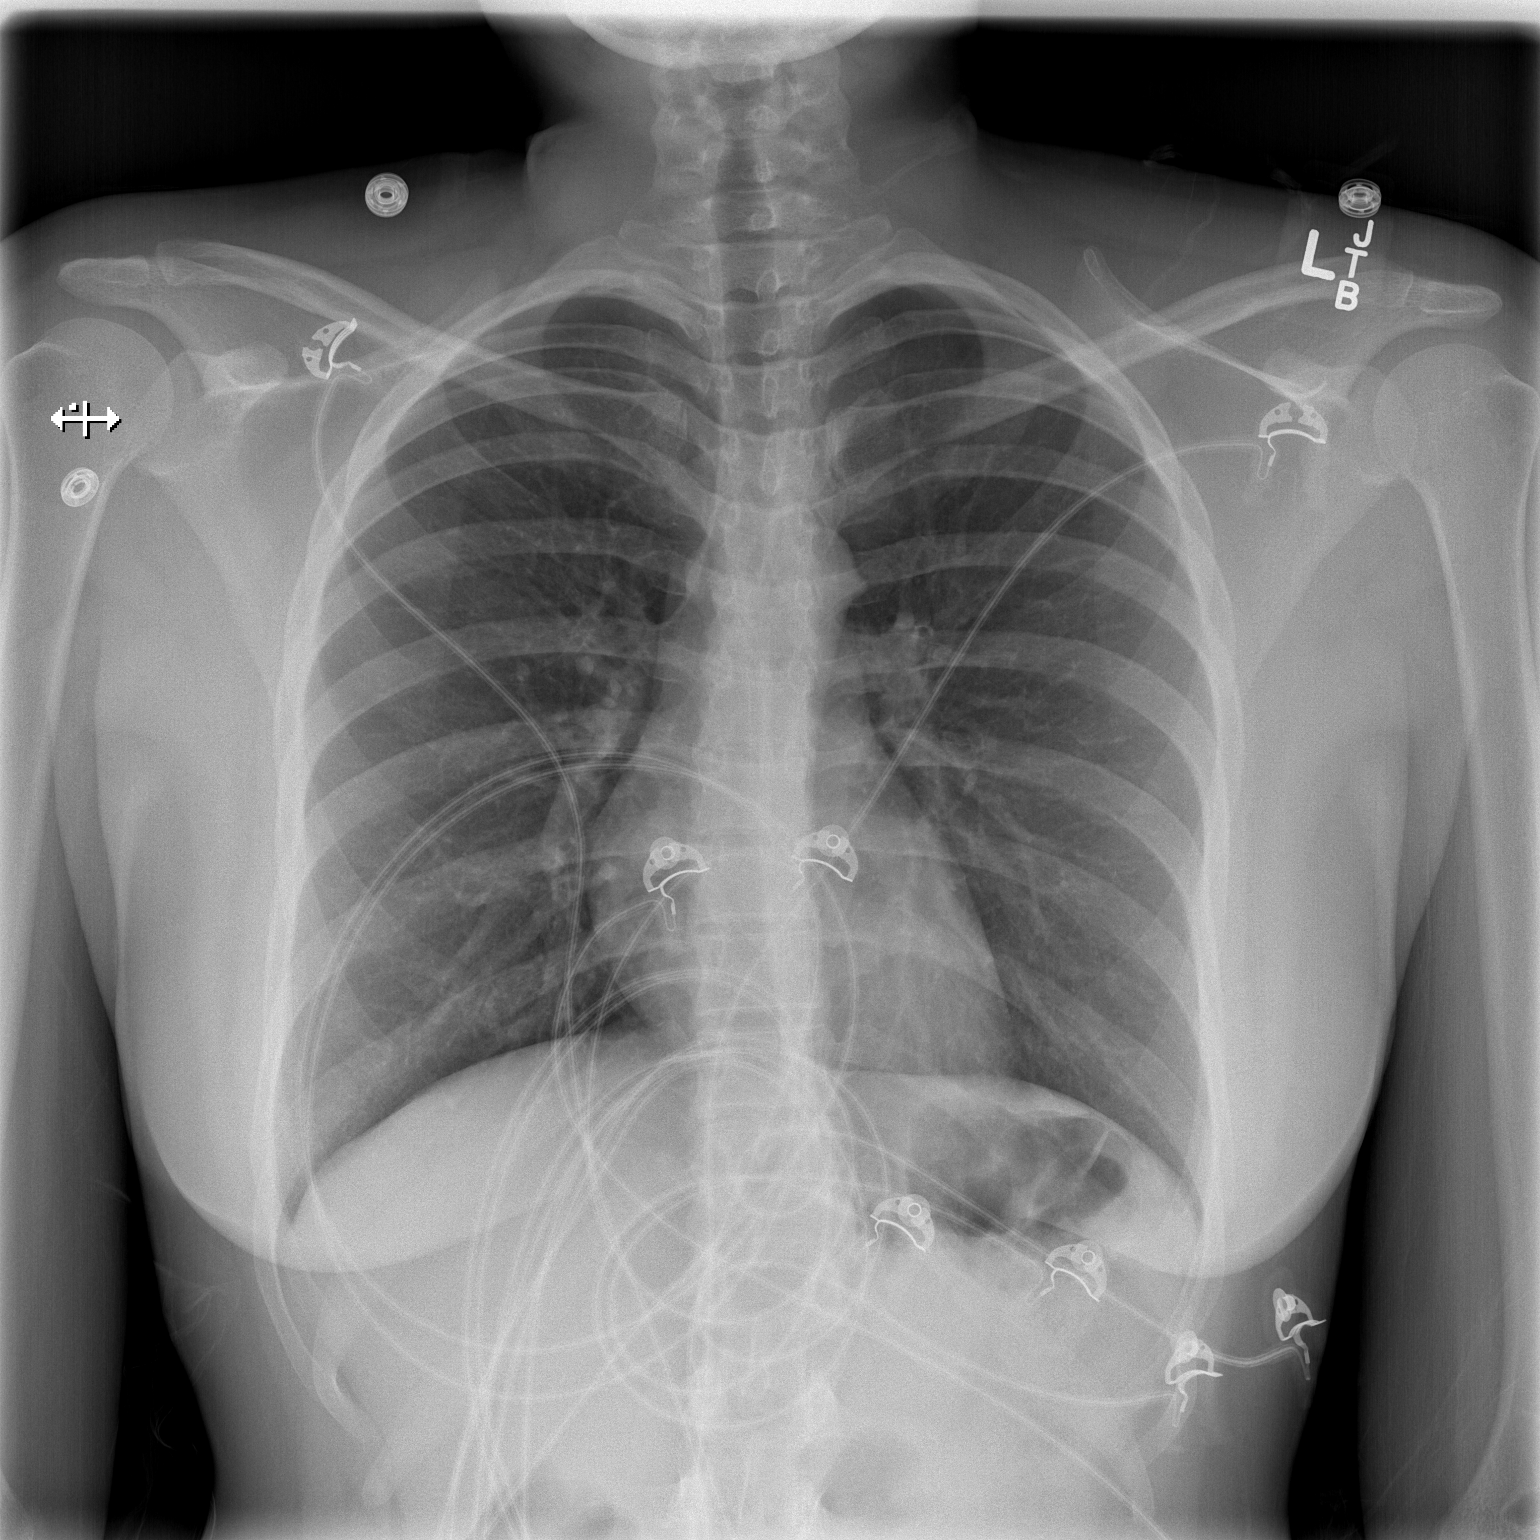

[w chest lat]
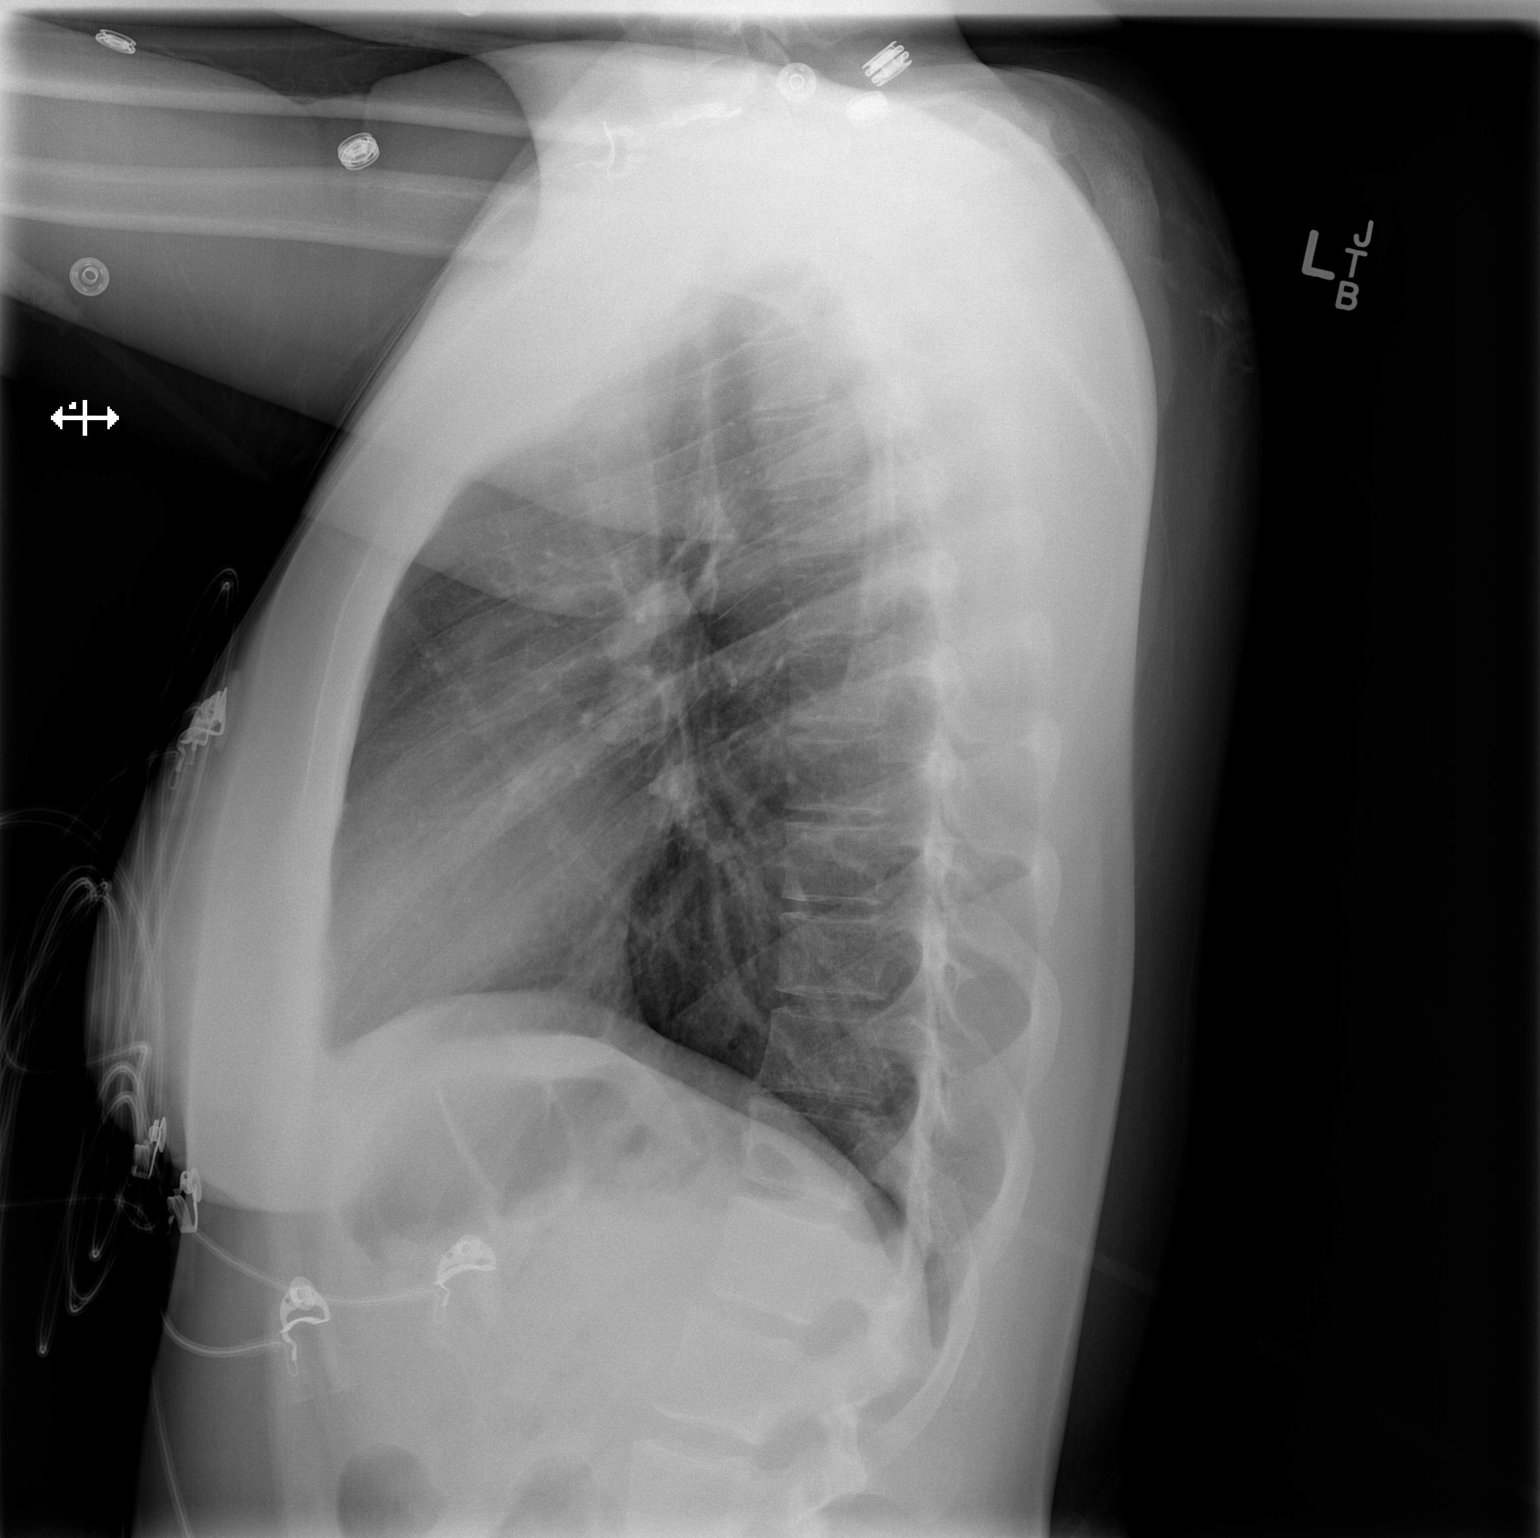

[2 of 2 positions shown; findings below may reference images not displayed]

FINDINGS: The heart size and mediastinal contours are within normal limits.
Both lungs are clear. The visualized skeletal structures are
unremarkable.
IMPRESSION: Normal chest x-ray.

## 2016-03-23 ENCOUNTER — Emergency Department (HOSPITAL_COMMUNITY)
Admission: EM | Admit: 2016-03-23 | Discharge: 2016-03-23 | Disposition: A | Discharge status: Home / Self Care | Payer: No Typology Code available for payment source | Attending: Emergency Medicine | Admitting: Emergency Medicine

## 2016-03-23 ENCOUNTER — Encounter (HOSPITAL_COMMUNITY): Payer: Self-pay | Admitting: Emergency Medicine

## 2016-03-23 DIAGNOSIS — H6691 Otitis media, unspecified, right ear: Secondary | ICD-10-CM

## 2016-03-23 DIAGNOSIS — Z79899 Other long term (current) drug therapy: Secondary | ICD-10-CM | POA: Insufficient documentation

## 2016-03-23 DIAGNOSIS — F17210 Nicotine dependence, cigarettes, uncomplicated: Secondary | ICD-10-CM | POA: Insufficient documentation

## 2016-03-23 DIAGNOSIS — J069 Acute upper respiratory infection, unspecified: Secondary | ICD-10-CM

## 2016-03-23 LAB — RAPID STREP SCREEN (MED CTR MEBANE ONLY): STREPTOCOCCUS, GROUP A SCREEN (DIRECT): NEGATIVE

## 2016-03-23 MED ORDER — AMOXICILLIN 500 MG PO CAPS
500.0000 mg | ORAL_CAPSULE | Freq: Once | ORAL | Status: AC
  Administered 2016-03-23: 500 mg via ORAL
  Filled 2016-03-23: qty 1

## 2016-03-23 MED ORDER — AMOXICILLIN 500 MG PO CAPS: 500.0000 mg | ORAL_CAPSULE | Freq: Two times a day (BID) | ORAL | Status: DC

## 2016-03-23 MED ORDER — IBUPROFEN 400 MG PO TABS
600.0000 mg | ORAL_TABLET | Freq: Once | ORAL | Status: AC
  Administered 2016-03-23: 600 mg via ORAL
  Filled 2016-03-23: qty 1

## 2016-03-23 NOTE — ED Notes (Signed)
Pt. Stated, I've had body aches with sore throat and ear pain for 3 days.

## 2016-03-23 NOTE — ED Provider Notes (Signed)
CSN: [id number]     Arrival date & time 03/23/16  1054 History   First MD Initiated Contact with Patient 03/23/16 1150     Chief Complaint  Patient presents with  . Generalized Body Aches  . Sore Throat  . Otalgia     (Consider location/radiation/quality/duration/timing/severity/associated sxs/prior Treatment) HPI   Kayla Massey is a 20 year old female with no significant past medical history who presents to the ED today complaining of sore throat and otalgia. Patient states that 3 days ago she began developing diffuse myalgias and chills. She subsequently developed a runny nose, cough with productive yellow sputum, sore throat and right-sided otalgia. Patient has been taking Alka-Seltzer cold plus with minimal relief of her symptoms. She denies any sick contacts. She denies any neck pain or stiffness, chest pain or difficulty breathing. No rash. She has had all of her immunizations.  History reviewed. No pertinent past medical history. History reviewed. No pertinent past surgical history. No family history on file. Social History  Substance Use Topics  . Smoking status: Current Some Day Smoker    Types: Cigarettes  . Smokeless tobacco: None  . Alcohol Use: No   OB History    No data available     Review of Systems  All other systems reviewed and are negative.     Allergies  Review of patient's allergies indicates no known allergies.  Home Medications   Prior to Admission medications   Medication Sig Start Date End Date Taking? Authorizing Provider  cephALEXin (KEFLEX) 500 MG capsule Take 1 capsule (500 mg total) by mouth 4 (four) times daily. 02/22/14   Tiffany Neva SeatGreene, PA-C  ibuprofen (ADVIL,MOTRIN) 200 MG tablet Take 400 mg by mouth every 6 (six) hours as needed.    Historical Provider, MD  ondansetron (ZOFRAN) 4 MG tablet Take 1 tablet (4 mg total) by mouth every 6 (six) hours as needed for nausea or vomiting. 02/23/14   Dione Boozeavid Glick, MD   BP 121/84 mmHg  Pulse 92   Temp(Src) 99.1 F (37.3 C) (Oral)  Wt 67.189 kg  SpO2 100%  LMP 03/09/2016 Physical Exam  Constitutional: She is oriented to person, place, and time. She appears well-developed and well-nourished. No distress.  HENT:  Head: Normocephalic and atraumatic.  Right Ear: Tympanic membrane is erythematous. A middle ear effusion is present.  Left Ear: Ear canal normal. Tympanic membrane is erythematous.  Nose: Rhinorrhea present.  Mouth/Throat: Uvula is midline and mucous membranes are normal. She does not have dentures. No oral lesions. No trismus in the jaw. Normal dentition. No dental abscesses, uvula swelling, lacerations or dental caries. Posterior oropharyngeal erythema present. No oropharyngeal exudate, posterior oropharyngeal edema or tonsillar abscesses.  Eyes: Conjunctivae are normal. Right eye exhibits no discharge. Left eye exhibits no discharge. No scleral icterus.  Neck: Neck supple.  Cardiovascular: Normal rate.   Pulmonary/Chest: Effort normal and breath sounds normal. No respiratory distress. She has no wheezes. She has no rales. She exhibits no tenderness.  Lymphadenopathy:    She has no cervical adenopathy.  Neurological: She is alert and oriented to person, place, and time. Coordination normal.  Skin: Skin is warm and dry. No rash noted. She is not diaphoretic. No erythema. No pallor.  Psychiatric: She has a normal mood and affect. Her behavior is normal.  Nursing note and vitals reviewed.   ED Course  Procedures (including critical care time) Labs Review Labs Reviewed  RAPID STREP SCREEN (NOT AT [facility])  CULTURE, GROUP A STREP [facility])  Imaging Review No results found. I have personally reviewed and evaluated these images and lab results as part of my medical decision-making.   EKG Interpretation None      MDM   Final diagnoses:  Acute right otitis media, recurrence not specified, unspecified otitis media type  URI (upper respiratory infection)    Patient  presents with otalgia and exam consistent with acute otitis media. No concern for acute mastoiditis, meningitis. No antibiotic use in the last month.  Patient discharged home with Amoxicillin. Pt also has assocaited symptoms of URI, symptomatic tx recommended. Pt appears well in the ED, non-toxic and non-septic appearing.  Advised PCP follow up. I have also discussed reasons to return immediately to the ER.  Pt expresses understanding and agrees with plan.      Lester Kinsman Felt, PA-C 03/23/16 2017  Alvira Monday, MD 03/25/16 1341

## 2016-03-23 NOTE — Discharge Instructions (Signed)
Otitis Media, Adult Otitis media is redness, soreness, and inflammation of the middle ear. Otitis media may be caused by allergies or, most commonly, by infection. Often it occurs as a complication of the common cold. SIGNS AND SYMPTOMS Symptoms of otitis media may include:  Earache.  Fever.  Ringing in your ear.  Headache.  Leakage of fluid from the ear. DIAGNOSIS To diagnose otitis media, your health care provider will examine your ear with an otoscope. This is an instrument that allows your health care provider to see into your ear in order to examine your eardrum. Your health care provider also will ask you questions about your symptoms. TREATMENT  Typically, otitis media resolves on its own within 3-5 days. Your health care provider may prescribe medicine to ease your symptoms of pain. If otitis media does not resolve within 5 days or is recurrent, your health care provider may prescribe antibiotic medicines if he or she suspects that a bacterial infection is the cause. HOME CARE INSTRUCTIONS   If you were prescribed an antibiotic medicine, finish it all even if you start to feel better.  Take medicines only as directed by your health care provider.  Keep all follow-up visits as directed by your health care provider. SEEK MEDICAL CARE IF:  You have otitis media only in one ear, or bleeding from your nose, or both.  You notice a lump on your neck.  You are not getting better in 3-5 days.  You feel worse instead of better. SEEK IMMEDIATE MEDICAL CARE IF:   You have pain that is not controlled with medicine.  You have swelling, redness, or pain around your ear or stiffness in your neck.  You notice that part of your face is paralyzed.  You notice that the bone behind your ear (mastoid) is tender when you touch it. MAKE SURE YOU:   Understand these instructions.  Will watch your condition.  Will get help right away if you are not doing well or get worse.   This  information is not intended to replace advice given to you by your health care provider. Make sure you discuss any questions you have with your health care provider.   Document Released: 06/05/2004 Document Revised: 09/21/2014 Document Reviewed: 03/28/2013 Elsevier Interactive Patient Education 2016 [employer]  Upper Respiratory Infection, Adult Most upper respiratory infections (URIs) are a viral infection of the air passages leading to the lungs. A URI affects the nose, throat, and upper air passages. The most common type of URI is nasopharyngitis and is typically referred to as "the common cold." URIs run their course and usually go away on their own. Most of the time, a URI does not require medical attention, but sometimes a bacterial infection in the upper airways can follow a viral infection. This is called a secondary infection. Sinus and middle ear infections are common types of secondary upper respiratory infections. Bacterial pneumonia can also complicate a URI. A URI can worsen asthma and chronic obstructive pulmonary disease (COPD). Sometimes, these complications can require emergency medical care and may be life threatening.  CAUSES Almost all URIs are caused by viruses. A virus is a type of germ and can spread from one person to another.  RISKS FACTORS You may be at risk for a URI if:   You smoke.   You have chronic heart or lung disease.  You have a weakened defense (immune) system.   You are very young or very old.   You have nasal allergies  or asthma.  You work in crowded or poorly ventilated areas.  You work in health care facilities or schools. SIGNS AND SYMPTOMS  Symptoms typically develop 2-3 days after you come in contact with a cold virus. Most viral URIs last 7-10 days. However, viral URIs from the influenza virus (flu virus) can last 14-18 days and are typically more severe. Symptoms may include:   Runny or stuffy (congested) nose.   Sneezing.    Cough.   Sore throat.   Headache.   Fatigue.   Fever.   Loss of appetite.   Pain in your forehead, behind your eyes, and over your cheekbones (sinus pain).  Muscle aches.  DIAGNOSIS  Your health care provider may diagnose a URI by:  Physical exam.  Tests to check that your symptoms are not due to another condition such as:  Strep throat.  Sinusitis.  Pneumonia.  Asthma. TREATMENT  A URI goes away on its own with time. It cannot be cured with medicines, but medicines may be prescribed or recommended to relieve symptoms. Medicines may help:  Reduce your fever.  Reduce your cough.  Relieve nasal congestion. HOME CARE INSTRUCTIONS   Take medicines only as directed by your health care provider.   Gargle warm saltwater or take cough drops to comfort your throat as directed by your health care provider.  Use a warm mist humidifier or inhale steam from a shower to increase air moisture. This may make it easier to breathe.  Drink enough fluid to keep your urine clear or pale yellow.   Eat soups and other clear broths and maintain good nutrition.   Rest as needed.   Return to work when your temperature has returned to normal or as your health care provider advises. You may need to stay home longer to avoid infecting others. You can also use a face mask and careful hand washing to prevent spread of the virus.  Increase the usage of your inhaler if you have asthma.   Do not use any tobacco products, including cigarettes, chewing tobacco, or electronic cigarettes. If you need help quitting, ask your health care provider. PREVENTION  The best way to protect yourself from getting a cold is to practice good hygiene.   Avoid oral or hand contact with people with cold symptoms.   Wash your hands often if contact occurs.  There is no clear evidence that vitamin C, vitamin E, echinacea, or exercise reduces the chance of developing a cold. However, it is  always recommended to get plenty of rest, exercise, and practice good nutrition.  SEEK MEDICAL CARE IF:   You are getting worse rather than better.   Your symptoms are not controlled by medicine.   You have chills.  You have worsening shortness of breath.  You have brown or red mucus.  You have yellow or brown nasal discharge.  You have pain in your face, especially when you bend forward.  You have a fever.  You have swollen neck glands.  You have pain while swallowing.  You have white areas in the back of your throat. SEEK IMMEDIATE MEDICAL CARE IF:   You have severe or persistent:  Headache.  Ear pain.  Sinus pain.  Chest pain.  You have chronic lung disease and any of the following:  Wheezing.  Prolonged cough.  Coughing up blood.  A change in your usual mucus.  You have a stiff neck.  You have changes in your:  Vision.  Hearing.  Thinking.  Mood. MAKE SURE YOU:   Understand these instructions.  Will watch your condition.  Will get help right away if you are not doing well or get worse.   This information is not intended to replace advice given to you by your health care provider. Make sure you discuss any questions you have with your health care provider.  Take antibiotics as prescribed for your ear. Continue taking home over-the-counter cold medication as needed. Take ibuprofen or Tylenol for pain and fever. Follow-up with your primary care doctor to your symptoms do not improve. Return to the ED if you experience severe worsening of her symptoms, difficulty hearing, neck pain or stiffness, difficulty breathing or swallowing.

## 2016-03-26 LAB — CULTURE, GROUP A STREP (THRC)

## 2017-03-08 ENCOUNTER — Encounter (HOSPITAL_COMMUNITY): Payer: Self-pay | Admitting: Emergency Medicine

## 2017-03-08 ENCOUNTER — Emergency Department (HOSPITAL_COMMUNITY)
Admission: EM | Admit: 2017-03-08 | Discharge: 2017-03-08 | Disposition: A | Discharge status: Home / Self Care | Payer: Medicaid Other | Attending: Emergency Medicine | Admitting: Emergency Medicine

## 2017-03-08 DIAGNOSIS — R112 Nausea with vomiting, unspecified: Secondary | ICD-10-CM | POA: Insufficient documentation

## 2017-03-08 DIAGNOSIS — R630 Anorexia: Secondary | ICD-10-CM | POA: Insufficient documentation

## 2017-03-08 DIAGNOSIS — N939 Abnormal uterine and vaginal bleeding, unspecified: Secondary | ICD-10-CM | POA: Insufficient documentation

## 2017-03-08 DIAGNOSIS — Z79899 Other long term (current) drug therapy: Secondary | ICD-10-CM | POA: Insufficient documentation

## 2017-03-08 DIAGNOSIS — R109 Unspecified abdominal pain: Secondary | ICD-10-CM | POA: Insufficient documentation

## 2017-03-08 DIAGNOSIS — R197 Diarrhea, unspecified: Secondary | ICD-10-CM | POA: Insufficient documentation

## 2017-03-08 LAB — URINALYSIS, ROUTINE W REFLEX MICROSCOPIC
BILIRUBIN URINE: NEGATIVE
Glucose, UA: NEGATIVE mg/dL
KETONES UR: 80 mg/dL — AB
LEUKOCYTES UA: NEGATIVE
NITRITE: NEGATIVE
PH: 6 (ref 5.0–8.0)
Protein, ur: 100 mg/dL — AB
SPECIFIC GRAVITY, URINE: 1.031 — AB (ref 1.005–1.030)

## 2017-03-08 LAB — COMPREHENSIVE METABOLIC PANEL
ALT: 12 U/L — ABNORMAL LOW (ref 14–54)
AST: 15 U/L (ref 15–41)
Albumin: 4 g/dL (ref 3.5–5.0)
Alkaline Phosphatase: 58 U/L (ref 38–126)
Anion gap: 12 (ref 5–15)
BILIRUBIN TOTAL: 0.7 mg/dL (ref 0.3–1.2)
BUN: 13 mg/dL (ref 6–20)
CHLORIDE: 105 mmol/L (ref 101–111)
CO2: 22 mmol/L (ref 22–32)
Calcium: 9.2 mg/dL (ref 8.9–10.3)
Creatinine, Ser: 0.96 mg/dL (ref 0.44–1.00)
Glucose, Bld: 80 mg/dL (ref 65–99)
Potassium: 3.3 mmol/L — ABNORMAL LOW (ref 3.5–5.1)
Sodium: 139 mmol/L (ref 135–145)
TOTAL PROTEIN: 7.5 g/dL (ref 6.5–8.1)

## 2017-03-08 LAB — CBC
HCT: 39.8 % (ref 36.0–46.0)
Hemoglobin: 13.5 g/dL (ref 12.0–15.0)
MCH: 25.7 pg — ABNORMAL LOW (ref 26.0–34.0)
MCHC: 33.9 g/dL (ref 30.0–36.0)
MCV: 75.7 fL — AB (ref 78.0–100.0)
Platelets: 289 10*3/uL (ref 150–400)
RBC: 5.26 MIL/uL — ABNORMAL HIGH (ref 3.87–5.11)
RDW: 13.8 % (ref 11.5–15.5)
WBC: 9.1 10*3/uL (ref 4.0–10.5)

## 2017-03-08 LAB — LIPASE, BLOOD: LIPASE: 19 U/L (ref 11–51)

## 2017-03-08 LAB — POC URINE PREG, ED: Preg Test, Ur: NEGATIVE

## 2017-03-08 MED ORDER — DICYCLOMINE HCL 20 MG PO TABS: 20.0000 mg | ORAL_TABLET | Freq: Three times a day (TID) | ORAL | 0 refills | Status: DC | PRN

## 2017-03-08 MED ORDER — PROMETHAZINE HCL 25 MG PO TABS: 25.0000 mg | ORAL_TABLET | Freq: Three times a day (TID) | ORAL | 0 refills | Status: DC | PRN

## 2017-03-08 MED ORDER — PROMETHAZINE HCL 25 MG/ML IJ SOLN
12.5000 mg | Freq: Once | INTRAMUSCULAR | Status: AC
  Administered 2017-03-08: 12.5 mg via INTRAVENOUS
  Filled 2017-03-08: qty 1

## 2017-03-08 MED ORDER — SODIUM CHLORIDE 0.9 % IV BOLUS (SEPSIS)
1000.0000 mL | Freq: Once | INTRAVENOUS | Status: AC
  Administered 2017-03-08: 1000 mL via INTRAVENOUS

## 2017-03-08 MED ORDER — ONDANSETRON 4 MG PO TBDP: 4.0000 mg | ORAL_TABLET | Freq: Three times a day (TID) | ORAL | 0 refills | Status: DC | PRN

## 2017-03-08 NOTE — ED Provider Notes (Signed)
[facility]-EMERGENCY DEPT Provider Note   CSN: [id number] Arrival date & time: 03/08/17  1443     History   Chief Complaint Chief Complaint  Patient presents with  . Abdominal Pain  . Vaginal Bleeding    HPI Kayla Massey is a 21 y.o. female.  HPI Patient presents with nausea vomiting diarrhea and abdominal pain. States that on Thursday, today being Monday, patient developed his symptoms [facility] States that she had a pelvic exam ultrasound CT scan. States she had an ovarian cyst that was bleeding. Continues to have diarrhea and increased pain. States she's not been able to eat anything this anytime she eats is going to come back up. Now has developed vaginal bleeding. States her menses is due to begin in another week. No fevers. No dysuria. No blood in the stool. States she was given Zofran and Zantac. States she was told the cyst was on the right side. Pain is crampy and in her lower abdomen. Pain is improved by vomiting and not changed by the diarrhea.   History reviewed. No pertinent past medical history.  There are no active problems to display for this patient.   History reviewed. No pertinent surgical history.  OB History    No data available       Home Medications    Prior to Admission medications   Medication Sig Start Date End Date Taking? Authorizing Provider  COMFORT GEL ANTACID ANTI-GAS 400-400-40 MG/5ML suspension Take 15 mLs by mouth 4 (four) times daily -  before meals and at bedtime. 03/06/17  Yes [provider]  ibuprofen (ADVIL,MOTRIN) 200 MG tablet Take 600 mg by mouth every 6 (six) hours as needed for moderate pain.    Yes [provider]  ranitidine (ZANTAC) 150 MG tablet Take 1 tablet by mouth 2 (two) times daily. 03/06/17  Yes [provider]  amoxicillin (AMOXIL) 500 MG capsule Take 1 capsule (500 mg total) by mouth 2 (two) times daily. Patient not taking: Reported on 03/08/2017 03/23/16   Dowless, Lelon MastSamantha  Tripp, PA-C  cephALEXin (KEFLEX) 500 MG capsule Take 1 capsule (500 mg total) by mouth 4 (four) times daily. Patient not taking: Reported on 03/08/2017 02/22/14   Marlon PelGreene, Tiffany, PA-C  dicyclomine (BENTYL) 20 MG tablet Take 1 tablet (20 mg total) by mouth 3 (three) times daily as needed for spasms. 03/08/17   Benjiman CorePickering, Daivik Overley, MD  ondansetron (ZOFRAN-ODT) 4 MG disintegrating tablet Take 1 tablet (4 mg total) by mouth every 8 (eight) hours as needed for nausea or vomiting. 03/08/17   Benjiman CorePickering, Katlyn Muldrew, MD  promethazine (PHENERGAN) 25 MG tablet Take 1 tablet (25 mg total) by mouth every 8 (eight) hours as needed for nausea or vomiting. 03/08/17   Benjiman CorePickering, Jochebed Bills, MD    Family History No family history on file.  Social History Social History  Substance Use Topics  . Smoking status: Never Smoker  . Smokeless tobacco: Not on file  . Alcohol use Yes     Comment: socially      Allergies   Patient has no known allergies.   Review of Systems Review of Systems  Constitutional: Positive for appetite change. Negative for fever.  HENT: Negative for congestion.   Eyes: Negative for visual disturbance.  Respiratory: Negative for shortness of breath.   Cardiovascular: Negative for chest pain.  Gastrointestinal: Positive for abdominal pain, diarrhea, nausea and vomiting.  Endocrine: Negative for polyuria.  Genitourinary: Positive for vaginal bleeding.  Musculoskeletal: Negative for arthralgias.  Skin: Negative for pallor.  Neurological: Negative for light-headedness.  Hematological: Negative for adenopathy.  Psychiatric/Behavioral: Negative for confusion.     Physical Exam Updated Vital Signs BP (!) 139/98 (BP Location: Left Arm)   Pulse 76   Temp 99.3 F (37.4 C) (Oral)   Resp 16   Ht 5\' 4"  (1.626 m)   Wt 67.1 kg (148 lb)   LMP 02/15/2017   SpO2 100%   BMI 25.40 kg/m   Physical Exam  Constitutional: She appears well-developed.  HENT:  Head: Normocephalic.  Eyes: Pupils are  equal, round, and reactive to light.  Neck: Neck supple.  Cardiovascular: Normal rate.   Pulmonary/Chest: Effort normal.  Abdominal: There is tenderness.  Mild lower abdominal tenderness. No rebound or guarding.  Musculoskeletal: She exhibits no edema.  Neurological: She is alert.  Skin: Skin is warm. Capillary refill takes less than 2 seconds.  Psychiatric: She has a normal mood and affect.     ED Treatments / Results  Labs (all labs ordered are listed, but only abnormal results are displayed) Labs Reviewed  COMPREHENSIVE METABOLIC PANEL - Abnormal; Notable for the following:       Result Value   Potassium 3.3 (*)    ALT 12 (*)    All other components within normal limits  CBC - Abnormal; Notable for the following:    RBC 5.26 (*)    MCV 75.7 (*)    MCH 25.7 (*)    All other components within normal limits  URINALYSIS, ROUTINE W REFLEX MICROSCOPIC - Abnormal; Notable for the following:    APPearance HAZY (*)    Specific Gravity, Urine 1.031 (*)    Hgb urine dipstick LARGE (*)    Ketones, ur 80 (*)    Protein, ur 100 (*)    Bacteria, UA RARE (*)    Squamous Epithelial / LPF 0-5 (*)    All other components within normal limits  LIPASE, BLOOD  POC URINE PREG, ED    EKG  EKG Interpretation None       Radiology No results found.  Procedures Procedures (including critical care time)  Medications Ordered in ED Medications  sodium chloride 0.9 % bolus 1,000 mL (0 mLs Intravenous Stopped 03/08/17 2344)  promethazine (PHENERGAN) injection 12.5 mg (12.5 mg Intravenous Given 03/08/17 2208)     Initial Impression / Assessment and Plan / ED Course  I have reviewed the triage vital signs and the nursing notes.  Pertinent labs & imaging results that were available during my care of the patient were reviewed by me and considered in my medical decision making (see chart for details).     Patient with nausea vomiting diarrhea. Also ovarian cysts. Reportedly had CT and  ultrasound done. Lab work reassuring. Feels better after treatment. Unable to view records from the outside hospital .but I'm taking the patient is worried that they were negative. Discharge home and follow-up as needed.  Final Clinical Impressions(s) / ED Diagnoses   Final diagnoses:  Abdominal pain, unspecified abdominal location  Nausea vomiting and diarrhea    New Prescriptions Discharge Medication List as of 03/08/2017 11:33 PM    START taking these medications   Details  dicyclomine (BENTYL) 20 MG tablet Take 1 tablet (20 mg total) by mouth 3 (three) times daily as needed for spasms., Starting Mon 03/08/2017, Print    promethazine (PHENERGAN) 25 MG tablet Take 1 tablet (25 mg total) by mouth every 8 (eight) hours as needed for nausea or vomiting., Starting  Mon 03/08/2017, Print         Benjiman Core, MD 03/10/17 1452

## 2017-03-08 NOTE — ED Triage Notes (Signed)
Pt from home with complaints of n/v/d that began on Thursday. Pt states she has been unable to hold down foods since this began. [facility] She was told she has an ovarian cyst that was bleeding. Pt states she was not told to follow up with an obgyn. Pt denies emesis today, but has had 2 episodes of diarrhea. Pt has had to use 2 pads today for the vaginal bleeding

## 2017-03-08 NOTE — ED Notes (Signed)
BLOOD DRAW ATTEMPT x2 UNSUCCESSFUL.  RN NOTIFIED.

## 2018-01-18 ENCOUNTER — Emergency Department (HOSPITAL_COMMUNITY): Payer: Self-pay

## 2018-01-18 ENCOUNTER — Encounter (HOSPITAL_COMMUNITY): Payer: Self-pay

## 2018-01-18 ENCOUNTER — Other Ambulatory Visit: Payer: Self-pay

## 2018-01-18 ENCOUNTER — Emergency Department (HOSPITAL_COMMUNITY)
Admission: EM | Admit: 2018-01-18 | Discharge: 2018-01-19 | Disposition: A | Discharge status: Home / Self Care | Payer: Self-pay | Attending: Emergency Medicine | Admitting: Emergency Medicine

## 2018-01-18 DIAGNOSIS — K5900 Constipation, unspecified: Secondary | ICD-10-CM | POA: Insufficient documentation

## 2018-01-18 DIAGNOSIS — R1084 Generalized abdominal pain: Secondary | ICD-10-CM | POA: Insufficient documentation

## 2018-01-18 DIAGNOSIS — Z87891 Personal history of nicotine dependence: Secondary | ICD-10-CM | POA: Insufficient documentation

## 2018-01-18 HISTORY — DX: Unspecified ovarian cyst, unspecified side: N83.209

## 2018-01-18 LAB — CBC WITH DIFFERENTIAL/PLATELET
BASOS PCT: 1 %
Basophils Absolute: 0.1 10*3/uL (ref 0.0–0.1)
EOS ABS: 0.1 10*3/uL (ref 0.0–0.7)
Eosinophils Relative: 1 %
HCT: 38.4 % (ref 36.0–46.0)
Hemoglobin: 12.8 g/dL (ref 12.0–15.0)
LYMPHS ABS: 3 10*3/uL (ref 0.7–4.0)
Lymphocytes Relative: 48 %
MCH: 24.7 pg — AB (ref 26.0–34.0)
MCHC: 33.3 g/dL (ref 30.0–36.0)
MCV: 74 fL — AB (ref 78.0–100.0)
Monocytes Absolute: 0.6 10*3/uL (ref 0.1–1.0)
Monocytes Relative: 9 %
Neutro Abs: 2.6 10*3/uL (ref 1.7–7.7)
Neutrophils Relative %: 41 %
PLATELETS: 352 10*3/uL (ref 150–400)
RBC: 5.19 MIL/uL — ABNORMAL HIGH (ref 3.87–5.11)
RDW: 13.9 % (ref 11.5–15.5)
WBC: 6.4 10*3/uL (ref 4.0–10.5)

## 2018-01-18 LAB — URINALYSIS, ROUTINE W REFLEX MICROSCOPIC
BILIRUBIN URINE: NEGATIVE
Glucose, UA: NEGATIVE mg/dL
Ketones, ur: 20 mg/dL — AB
LEUKOCYTES UA: NEGATIVE
NITRITE: NEGATIVE
Protein, ur: NEGATIVE mg/dL
SPECIFIC GRAVITY, URINE: 1.021 (ref 1.005–1.030)
pH: 6 (ref 5.0–8.0)

## 2018-01-18 LAB — BASIC METABOLIC PANEL
Anion gap: 8 (ref 5–15)
BUN: 9 mg/dL (ref 6–20)
CO2: 24 mmol/L (ref 22–32)
CREATININE: 0.79 mg/dL (ref 0.44–1.00)
Calcium: 9.1 mg/dL (ref 8.9–10.3)
Chloride: 108 mmol/L (ref 101–111)
GFR calc Af Amer: >60 mL/min (ref >60)
GFR calc non Af Amer: >60 mL/min (ref >60)
GLUCOSE: 94 mg/dL (ref 65–99)
Potassium: 3.6 mmol/L (ref 3.5–5.1)
Sodium: 140 mmol/L (ref 135–145)

## 2018-01-18 LAB — I-STAT BETA HCG BLOOD, ED (MC, WL, AP ONLY)

## 2018-01-18 NOTE — ED Triage Notes (Signed)
Pt presents with constipation x 8 days.  Pt reports generalized abdominal pain, taking OTC laxatives without relief, went to PCP and referred here. +nausea

## 2018-01-18 NOTE — ED Provider Notes (Signed)
[facility] EMERGENCY DEPARTMENT Provider Note   CSN: [id number] Arrival date & time: 01/18/18  1721     History   Chief Complaint Chief Complaint  Patient presents with  . Constipation    HPI Kayla Massey is a 22 y.o. female.  Kayla Massey is a 22 y.o. Female who is otherwise healthy, presents to the emergency department for evaluation of constipation.  Patient reports she not been able to have a bowel movement for the past 8 days.  Patient reports she typically has approximately 2 bowel movements a week.  Patient reports associated intermittent generalized abdominal pain, but primarily she is having rectal discomfort.  Patient reports "it feels like I have to go constantly but I am unable to go".  Patient reports prior to this she was not experiencing any diarrhea or blood in the stool.  Patient has been taking over-the-counter laxatives for the past 2 days without any, went to see her primary care doctor and was referred to the emergency department she reports some associated occasional nausea but no episodes of vomiting.  Patient denies any fevers.  No urinary symptoms no chest pain or shortness of breath.  No history of abdominal surgeries.     Past Medical History:  Diagnosis Date  . Ovarian cyst     There are no active problems to display for this patient.   History reviewed. No pertinent surgical history.   OB History   None      Home Medications    Prior to Admission medications   Medication Sig Start Date End Date Taking? Authorizing Provider  amoxicillin (AMOXIL) 500 MG capsule Take 1 capsule (500 mg total) by mouth 2 (two) times daily. Patient not taking: Reported on 03/08/2017 03/23/16   Dowless, Lelon Mast Tripp, PA-C  cephALEXin (KEFLEX) 500 MG capsule Take 1 capsule (500 mg total) by mouth 4 (four) times daily. Patient not taking: Reported on 03/08/2017 02/22/14   Marlon Pel, PA-C  dicyclomine (BENTYL) 20 MG tablet Take 1 tablet (20  mg total) by mouth 3 (three) times daily as needed for spasms. Patient not taking: Reported on 01/18/2018 03/08/17   Benjiman Core, MD  magnesium citrate SOLN Take 296 mLs (1 Bottle total) by mouth once for 1 dose. 01/19/18 01/19/18  Dartha Lodge, PA-C  ondansetron (ZOFRAN-ODT) 4 MG disintegrating tablet Take 1 tablet (4 mg total) by mouth every 8 (eight) hours as needed for nausea or vomiting. Patient not taking: Reported on 01/18/2018 03/08/17   Benjiman Core, MD  polyethylene glycol [facility]) packet Take 17 g by mouth daily. 01/19/18   Dartha Lodge, PA-C  promethazine (PHENERGAN) 25 MG tablet Take 1 tablet (25 mg total) by mouth every 8 (eight) hours as needed for nausea or vomiting. Patient not taking: Reported on 01/18/2018 03/08/17   Benjiman Core, MD    Family History History reviewed. No pertinent family history.  Social History Social History   Tobacco Use  . Smoking status: Former Smoker    Types: Cigarettes  . Smokeless tobacco: Never Used  Substance Use Topics  . Alcohol use: Yes    Comment: socially   . Drug use: No     Allergies   Patient has no known allergies.   Review of Systems Review of Systems  Constitutional: Negative for chills and fever.  HENT: Negative for congestion, rhinorrhea and sore throat.   Eyes: Negative for visual disturbance.  Respiratory: Negative for cough and shortness of breath.   Cardiovascular:  Negative for chest pain.  Gastrointestinal: Positive for abdominal pain, constipation, nausea and rectal pain. Negative for blood in stool and vomiting.  Genitourinary: Negative for dysuria, flank pain, frequency, vaginal bleeding and vaginal discharge.  Musculoskeletal: Negative for arthralgias, back pain and myalgias.  Skin: Negative for color change and rash.  Neurological: Negative for dizziness, syncope and light-headedness.     Physical Exam Updated Vital Signs BP 123/87 (BP Location: Right Arm)   Pulse 73   Temp 98.3  F (36.8 C) (Oral)   Resp 16   Ht  (1.626 m)   Wt 65.8 kg (145 lb)   LMP 01/11/2018 (Exact Date)   SpO2 100%   BMI 24.89 kg/m   Physical Exam  Constitutional: She appears well-developed and well-nourished. No distress.  HENT:  Head: Normocephalic and atraumatic.  Mouth/Throat: Oropharynx is clear and moist.  Mucous membranes moist  Eyes: Right eye exhibits no discharge. Left eye exhibits no discharge.  Neck: Neck supple.  Cardiovascular: Normal rate, regular rhythm, normal heart sounds and intact distal pulses.  Pulmonary/Chest: Effort normal and breath sounds normal. No stridor. No respiratory distress. She has no wheezes. She has no rales.  Respirations equal and unlabored, patient able to speak in full sentences, lungs clear to auscultation bilaterally  Abdominal: Soft. Bowel sounds are normal. She exhibits no distension and no mass. There is no tenderness. There is no guarding.  Abdomen soft, nondistended, nontender to palpation throughout without guarding or rebound tenderness  Musculoskeletal: She exhibits no edema or deformity.  Neurological: She is alert. Coordination normal.  Mo all extremities without difficultyving   Skin: Skin is warm and dry. She is not diaphoretic.  Psychiatric: She has a normal mood and affect. Her behavior is normal.  Nursing note and vitals reviewed.    ED Treatments / Results  Labs (all labs ordered are listed, but only abnormal results are displayed) Labs Reviewed  CBC WITH DIFFERENTIAL/PLATELET - Abnormal; Notable for the following components:      Result Value   RBC 5.19 (*)    MCV 74.0 (*)    MCH 24.7 (*)    All other components within normal limits  URINALYSIS, ROUTINE W REFLEX MICROSCOPIC - Abnormal; Notable for the following components:   Hgb urine dipstick SMALL (*)    Ketones, ur 20 (*)    Bacteria, UA RARE (*)    All other components within normal limits  BASIC METABOLIC PANEL  I-STAT BETA HCG BLOOD, ED ([facility], [facility], [facility]  ONLY)    EKG None  Radiology Dg Abdomen 1 View  Result Date: 01/18/2018 CLINICAL DATA:  Constipation for 8 days. EXAM: ABDOMEN - 1 VIEW COMPARISON:  None. FINDINGS: Bowel gas pattern is nonobstructive. No radiopaque calculi are seen. No osseous findings. Moderate stool burden. IMPRESSION: Moderate stool burden suggesting constipation. Electronically Signed   By: Elsie Stain M.D.   On: 01/18/2018 19:39    Procedures Procedures (including critical care time)  Medications Ordered in ED Medications - No data to display   Initial Impression / Assessment and Plan / ED Course  I have reviewed the triage vital signs and the nursing notes.  Pertinent labs & imaging results that were available during my care of the patient were reviewed by me and considered in my medical decision making (see chart for details).  Patient is in the emergency room for evaluation of constipation, unable to have bowel movement for the past 8 days, although on average patient only has 2 bowel movements  a week.  Patient has tried over-the-counter laxatives for the past 2 days without relief.  Reports intermittent generalized abdominal pain, occasional nausea, no vomiting, no fevers or chills.  No history of abdominal surgeries.  Vitals normal and patient is in no acute distress.  Abdomen benign on exam.  Labs and abdominal x-ray ordered from triage.  Labs overall reassuring, no leukocytosis, normal hemoglobin, no acute derangements, normal renal and liver function, normal lipase.  Negative pregnancy, UA without evidence of infection.  Abdominal x-ray shows moderate stool burden a nonobstructive gas pattern.  No free air.  RN manually disimpacted patient here in the emergency department at which patient was able to have a small bowel movement here in the ED. at this time patient is stable for discharge home, will have her use mag citrate, and then continue with daily MiraLAX.  He should follow-up with her primary care  doctor, will follow provided to GI as well.  Return precautions discussed.  Final Clinical Impressions(s) / ED Diagnoses   Final diagnoses:  Constipation, unspecified constipation type  Generalized abdominal pain    ED Discharge Orders        Ordered    magnesium citrate SOLN   Once     01/19/18 0037    polyethylene glycol (MIRALAX / GLYCOLAX) packet  Daily     01/19/18 0037       Dartha Lodge, PA-C 01/19/18 1308    Shon Baton, MD 01/25/18 [phone]

## 2018-01-19 MED ORDER — MAGNESIUM CITRATE PO SOLN: 1.0000 | Freq: Once | ORAL | 0 refills | Status: AC

## 2018-01-19 MED ORDER — POLYETHYLENE GLYCOL 3350 17 G PO PACK: 17.0000 g | PACK | Freq: Every day | ORAL | 0 refills | Status: DC

## 2018-01-19 NOTE — ED Notes (Signed)
Patient disimpacted small amount of hard stools with female NT chaperone .

## 2018-01-19 NOTE — Discharge Instructions (Addendum)
Your lab work overall is very reassuring.  X-ray does show moderate amount of stool present in the colon.  You are disimpacted here today in the emergency department.  Please use magnesium citrate once tomorrow and then continue with laxatives and MiraLAX daily.  He will need to follow-up with your primary care doctor he may also follow-up with equal Gi.  Return to the emergency department for worsening or localized abdominal pain, fevers, nausea or vomiting, or any other new or concerning symptoms.

## 2018-06-05 ENCOUNTER — Ambulatory Visit (HOSPITAL_COMMUNITY)
Admission: EM | Admit: 2018-06-05 | Discharge: 2018-06-05 | Disposition: A | Discharge status: Home / Self Care | Payer: Self-pay | Attending: Internal Medicine | Admitting: Internal Medicine

## 2018-06-05 ENCOUNTER — Other Ambulatory Visit: Payer: Self-pay

## 2018-06-05 ENCOUNTER — Encounter (HOSPITAL_COMMUNITY): Payer: Self-pay | Admitting: Emergency Medicine

## 2018-06-05 DIAGNOSIS — R1084 Generalized abdominal pain: Secondary | ICD-10-CM

## 2018-06-05 DIAGNOSIS — Z3202 Encounter for pregnancy test, result negative: Secondary | ICD-10-CM

## 2018-06-05 DIAGNOSIS — Z113 Encounter for screening for infections with a predominantly sexual mode of transmission: Secondary | ICD-10-CM

## 2018-06-05 LAB — POCT URINALYSIS DIP (DEVICE)
BILIRUBIN URINE: NEGATIVE
Glucose, UA: NEGATIVE mg/dL
KETONES UR: NEGATIVE mg/dL
Nitrite: NEGATIVE
PROTEIN: NEGATIVE mg/dL
Specific Gravity, Urine: 1.02 (ref 1.005–1.030)
Urobilinogen, UA: 1 mg/dL (ref 0.0–1.0)
pH: 7 (ref 5.0–8.0)

## 2018-06-05 LAB — POCT PREGNANCY, URINE: PREG TEST UR: NEGATIVE

## 2018-06-05 MED ORDER — NAPROXEN 500 MG PO TABS: 500.0000 mg | ORAL_TABLET | Freq: Two times a day (BID) | ORAL | 0 refills | Status: DC

## 2018-06-05 NOTE — ED Triage Notes (Addendum)
Complains of abdominal pain intermittently for 2 weeks.  Reports intermittent bleeding too.   Denies burning with urination Denies changes in birth control-not on any birth control LMP 05/20/18-normal period per patient No vaginal discharge, but has odor

## 2018-06-05 NOTE — ED Provider Notes (Signed)
  MRN: [id number] DOB: 1996-07-28  Subjective:   Kayla Massey is a 22 y.o. female presenting for 2 week history of intermittent lower abdominal pain. Also has had some nausea without vomiting, spotting, change in vaginal odor. Has tried APAP with some relief. LMP was 05/20/2018, was regular. Denies fever, dysuria, hematuria, urinary frequency, genital rashes, vaginal discharge, constipation, hard stools. Has normal bowel movement daily/every other day. Hydrates with about 4 bottles of water daily. Patient is sexually active, does not use condoms for protection.  reports that she has quit smoking. Her smoking use included cigarettes. She has never used smokeless tobacco. She reports that she drinks alcohol. She reports that she does not use drugs. Has 1 drink of alcohol per week.   She is not currently taking any medications. No Known Allergies.   Past Medical History:  Diagnosis Date  . Ovarian cyst     History reviewed. No pertinent surgical history.   Her family history includes Healthy in her father and mother.    Objective:   Vitals: BP 116/76 (BP Location: Right Arm)   Pulse 67   Temp 98.2 F (36.8 C) (Oral)   Resp 18   LMP 05/20/2018   SpO2 100%   Physical Exam  Constitutional: She is oriented to person, place, and time. She appears well-developed and well-nourished.  HENT:  Mouth/Throat: Oropharynx is clear and moist.  Eyes: No scleral icterus.  Cardiovascular: Normal rate, regular rhythm, normal heart sounds and intact distal pulses. Exam reveals no gallop and no friction rub.  No murmur heard. Pulmonary/Chest: Effort normal and breath sounds normal. No stridor. No respiratory distress. She has no wheezes. She has no rales.  Abdominal: Soft. Bowel sounds are normal. She exhibits no distension and no mass. There is tenderness (generalized throughout). There is no rebound and no guarding.  No CVA tenderness.  Musculoskeletal: She exhibits no edema.  Neurological: She is  alert and oriented to person, place, and time.  Skin: Skin is warm and dry. No rash noted. No erythema. No pallor.   Results for orders placed or performed during the hospital encounter of 06/05/18 (from the past 24 hour(s))  POCT urinalysis dip (device)     Status: Abnormal   Collection Time: 06/05/18 11:57 AM  Result Value Ref Range   Glucose, UA NEGATIVE NEGATIVE mg/dL   Bilirubin Urine NEGATIVE NEGATIVE   Ketones, ur NEGATIVE NEGATIVE mg/dL   Specific Gravity, Urine 1.020 1.005 - 1.030   Hgb urine dipstick TRACE (A) NEGATIVE   pH 7.0 5.0 - 8.0   Protein, ur NEGATIVE NEGATIVE mg/dL   Urobilinogen, UA 1.0 0.0 - 1.0 mg/dL   Nitrite NEGATIVE NEGATIVE   Leukocytes, UA TRACE (A) NEGATIVE   [facility]  Assessment and Plan :   Generalized abdominal pain  Patient declined pelvic exam today.  I did not appreciate pelvic or lower abdominal tenderness on exam.  For now we will use naproxen to manage her pain and inflammation.  STI testing, urine culture pending.  Counseled patient that this may be a recurrent ovarian cyst, recommended that she check in with her gynecologist for this.  ER return to clinic precautions reviewed.   Wallis BambergMani, Eleftheria Taborn, PA-C 06/05/18 1246

## 2018-06-06 ENCOUNTER — Other Ambulatory Visit: Payer: Self-pay | Admitting: Urgent Care

## 2018-06-06 LAB — CERVICOVAGINAL ANCILLARY ONLY
BACTERIAL VAGINITIS: POSITIVE — AB
CANDIDA VAGINITIS: NEGATIVE
Chlamydia: POSITIVE — AB
NEISSERIA GONORRHEA: NEGATIVE
TRICH (WINDOWPATH): POSITIVE — AB

## 2018-06-06 LAB — URINE CULTURE: Culture: <10000 — AB

## 2018-06-06 LAB — POCT PREGNANCY, URINE: Preg Test, Ur: NEGATIVE

## 2018-06-06 MED ORDER — AZITHROMYCIN 500 MG PO TABS: 1000.0000 mg | ORAL_TABLET | Freq: Once | ORAL | 0 refills | Status: AC

## 2018-06-06 MED ORDER — METRONIDAZOLE 500 MG PO TABS: 500.0000 mg | ORAL_TABLET | Freq: Two times a day (BID) | ORAL | 0 refills | Status: DC

## 2018-06-09 ENCOUNTER — Telehealth (HOSPITAL_COMMUNITY): Payer: Self-pay

## 2018-06-09 NOTE — Telephone Encounter (Signed)
Pt called back and was given instruction by [facility] staff member.

## 2018-09-05 ENCOUNTER — Encounter (HOSPITAL_COMMUNITY): Payer: Self-pay

## 2018-09-05 ENCOUNTER — Other Ambulatory Visit: Payer: Self-pay

## 2018-09-05 ENCOUNTER — Ambulatory Visit (HOSPITAL_COMMUNITY)
Admission: EM | Admit: 2018-09-05 | Discharge: 2018-09-05 | Disposition: A | Discharge status: Home / Self Care | Payer: No Typology Code available for payment source | Attending: Family Medicine | Admitting: Family Medicine

## 2018-09-05 DIAGNOSIS — R102 Pelvic and perineal pain: Secondary | ICD-10-CM | POA: Diagnosis not present

## 2018-09-05 DIAGNOSIS — R1030 Lower abdominal pain, unspecified: Secondary | ICD-10-CM

## 2018-09-05 DIAGNOSIS — N898 Other specified noninflammatory disorders of vagina: Secondary | ICD-10-CM | POA: Insufficient documentation

## 2018-09-05 DIAGNOSIS — Z3202 Encounter for pregnancy test, result negative: Secondary | ICD-10-CM

## 2018-09-05 LAB — POCT URINALYSIS DIP (DEVICE)
Glucose, UA: NEGATIVE mg/dL
KETONES UR: 80 mg/dL — AB
Nitrite: NEGATIVE
PH: 6 (ref 5.0–8.0)
Protein, ur: NEGATIVE mg/dL
Specific Gravity, Urine: 1.02 (ref 1.005–1.030)
Urobilinogen, UA: 0.2 mg/dL (ref 0.0–1.0)

## 2018-09-05 LAB — POCT PREGNANCY, URINE: Preg Test, Ur: NEGATIVE

## 2018-09-05 MED ORDER — METRONIDAZOLE 500 MG PO TABS: 500.0000 mg | ORAL_TABLET | Freq: Two times a day (BID) | ORAL | 0 refills | Status: AC

## 2018-09-05 NOTE — Discharge Instructions (Signed)
We are going to retreat you for both bacterial vaginosis and trichomonas with the same medicine.  Please begin taking metronidazole twice daily for the next week.  Do not drink alcohol until 24 hours after taking the last tablet.  Please check with your partner to ensure that he was treated previously as well, please hold off on sexual intercourse until 1 week after both being treated.  Please follow-up if developing worsening abdominal pain, worsening symptoms, persistent symptoms, fever, nausea or vomiting.

## 2018-09-05 NOTE — ED Triage Notes (Addendum)
Pt cc abdominal pain / pelvis pain x 2 weeks. Pt some vaginal discharge.pt thinks she may have BV and maybe pregnant.

## 2018-09-06 LAB — CERVICOVAGINAL ANCILLARY ONLY
Bacterial vaginitis: POSITIVE — AB
CANDIDA VAGINITIS: NEGATIVE
Chlamydia: NEGATIVE
NEISSERIA GONORRHEA: NEGATIVE
TRICH (WINDOWPATH): NEGATIVE

## 2018-09-07 NOTE — ED Provider Notes (Signed)
[facility]    CSN: [id number] Arrival date & time: 09/05/18  1026     History   Chief Complaint Chief Complaint  Patient presents with  . Abdominal Pain    HPI Kayla Massey is a 22 y.o. female history of previous ovarian cyst presenting today for evaluation of lower abdominal pain and vaginal discharge.  Patient states that she has had vaginal discharge for the past 2 weeks.  She has had some mild lower abdominal discomfort.  Patient states that she had similar symptoms recently in September.  She was tested for STDs.  Patient was positive for chlamydia, trichomonas as well as bacterial vaginosis.  She is having symptoms similar to this, and is concerned about BV.  She states that her partner was tested as well but believes he only "received the shot".  She does not believe he was treated with any pills.  She is concerned about things not fully clearing up.  Denies any fevers, nausea, vomiting.  Denies dysuria or increased frequency.  Last menstrual period was towards the end of November.  HPI  Past Medical History:  Diagnosis Date  . Ovarian cyst     There are no active problems to display for this patient.   History reviewed. No pertinent surgical history.  OB History   No obstetric history on file.      Home Medications    Prior to Admission medications   Medication Sig Start Date End Date Taking? Authorizing Provider  metroNIDAZOLE (FLAGYL) 500 MG tablet Take 1 tablet (500 mg total) by mouth 2 (two) times daily for 7 days. 09/05/18 09/12/18  Wieters, Hallie C, PA-C  naproxen (NAPROSYN) 500 MG tablet Take 1 tablet (500 mg total) by mouth 2 (two) times daily. 06/05/18   Wallis BambergMani, Mario, PA-C    Family History Family History  Problem Relation Age of Onset  . Healthy Mother   . Healthy Father     Social History Social History   Tobacco Use  . Smoking status: Former Smoker    Types: Cigarettes  . Smokeless tobacco: Never Used  Substance Use Topics    . Alcohol use: Yes    Comment: socially   . Drug use: No     Allergies   Patient has no known allergies.   Review of Systems Review of Systems  Constitutional: Negative for fever.  Respiratory: Negative for shortness of breath.   Cardiovascular: Negative for chest pain.  Gastrointestinal: Positive for abdominal pain. Negative for diarrhea, nausea and vomiting.  Genitourinary: Positive for vaginal discharge. Negative for dysuria, flank pain, genital sores, hematuria, menstrual problem, vaginal bleeding and vaginal pain.  Musculoskeletal: Negative for back pain.  Skin: Negative for rash.  Neurological: Negative for dizziness, light-headedness and headaches.     Physical Exam Triage Vital Signs ED Triage Vitals  Enc Vitals Group     BP 09/05/18 1232 120/74     Pulse Rate 09/05/18 1232 88     Resp 09/05/18 1232 18     Temp 09/05/18 1232 97.8 F (36.6 C)     Temp src --      SpO2 09/05/18 1232 100 %     Weight 09/05/18 1204 131 lb (59.4 kg)     Height --      Head Circumference --      Peak Flow --      Pain Score 09/05/18 1204 6     Pain Loc --      Pain Edu? --  Excl. in GC? --    No data found.  Updated Vital Signs BP 120/74 (BP Location: Right Arm)   Pulse 88   Temp 97.8 F (36.6 C)   Resp 18   Wt 131 lb (59.4 kg)   LMP 08/11/2018   SpO2 100%   BMI 22.49 kg/m   Visual Acuity Right Eye Distance:   Left Eye Distance:   Bilateral Distance:    Right Eye Near:   Left Eye Near:    Bilateral Near:     Physical Exam Vitals signs and nursing note reviewed.  Constitutional:      Appearance: She is well-developed.     Comments: No acute distress  HENT:     Head: Normocephalic and atraumatic.     Nose: Nose normal.  Eyes:     Conjunctiva/sclera: Conjunctivae normal.  Neck:     Musculoskeletal: Neck supple.  Cardiovascular:     Rate and Rhythm: Normal rate.  Pulmonary:     Effort: Pulmonary effort is normal. No respiratory distress.   Abdominal:     General: There is no distension.     Comments: Abdomen soft, nondistended, nontender to light and deep palpation throughout upper abdomen and epigastrium, mild tenderness to palpation of bilateral lower quadrants and suprapubic area.  Negative rebound.  No focal tenderness.  Negative McBurney's.  Musculoskeletal: Normal range of motion.  Skin:    General: Skin is warm and dry.  Neurological:     Mental Status: She is alert and oriented to person, place, and time.      UC Treatments / Results  Labs (all labs ordered are listed, but only abnormal results are displayed) Labs Reviewed  POCT URINALYSIS DIP (DEVICE) - Abnormal; Notable for the following components:      Result Value   Bilirubin Urine SMALL (*)    Ketones, ur 80 (*)    Hgb urine dipstick SMALL (*)    Leukocytes, UA TRACE (*)    All other components within normal limits  CERVICOVAGINAL ANCILLARY ONLY - Abnormal; Notable for the following components:   Bacterial vaginitis **POSITIVE for Gardnerella vaginalis** (*)    All other components within normal limits  POC URINE PREG, ED  POCT PREGNANCY, URINE    EKG None  Radiology No results found.  Procedures Procedures (including critical care time)  Medications Ordered in UC Medications - No data to display  Initial Impression / Assessment and Plan / UC Course  I have reviewed the triage vital signs and the nursing notes.  Pertinent labs & imaging results that were available during my care of the patient were reviewed by me and considered in my medical decision making (see chart for details).     We will retreat patient for BV/trichomoniasis with metronidazole.  Swab obtained to confirm resolution of other STDs.  Recommended patient to have discussion with partner about being retested for trichomonas if he did not receive pills.  Advised to abstain from intercourse for a week as well as until he fully gets checked out again.Discussed strict return  precautions. Patient verbalized understanding and is agreeable with plan.  Final Clinical Impressions(s) / UC Diagnoses   Final diagnoses:  Lower abdominal pain  Vaginal discharge     Discharge Instructions     We are going to retreat you for both bacterial vaginosis and trichomonas with the same medicine.  Please begin taking metronidazole twice daily for the next week.  Do not drink alcohol until 24 hours after taking the  last tablet.  Please check with your partner to ensure that he was treated previously as well, please hold off on sexual intercourse until 1 week after both being treated.  Please follow-up if developing worsening abdominal pain, worsening symptoms, persistent symptoms, fever, nausea or vomiting.   ED Prescriptions    Medication Sig Dispense Auth. Provider   metroNIDAZOLE (FLAGYL) 500 MG tablet Take 1 tablet (500 mg total) by mouth 2 (two) times daily for 7 days. 14 tablet Wieters, SecorHallie C, PA-C     Controlled Substance Prescriptions [place] Controlled Substance Registry consulted? Not Applicable   Lew DawesWieters, Hallie C, [place]PA-C 09/07/18 [phone]

## 2018-11-28 ENCOUNTER — Ambulatory Visit: Payer: No Typology Code available for payment source

## 2019-03-09 ENCOUNTER — Encounter (HOSPITAL_COMMUNITY): Payer: Self-pay | Admitting: Emergency Medicine

## 2019-03-09 ENCOUNTER — Ambulatory Visit (HOSPITAL_COMMUNITY)
Admission: EM | Admit: 2019-03-09 | Discharge: 2019-03-09 | Disposition: A | Discharge status: Home / Self Care | Payer: No Typology Code available for payment source | Attending: Internal Medicine | Admitting: Internal Medicine

## 2019-03-09 ENCOUNTER — Other Ambulatory Visit: Payer: Self-pay

## 2019-03-09 DIAGNOSIS — N760 Acute vaginitis: Secondary | ICD-10-CM | POA: Diagnosis not present

## 2019-03-09 DIAGNOSIS — B9689 Other specified bacterial agents as the cause of diseases classified elsewhere: Secondary | ICD-10-CM

## 2019-03-09 MED ORDER — METRONIDAZOLE 500 MG PO TABS: 500.0000 mg | ORAL_TABLET | Freq: Two times a day (BID) | ORAL | 0 refills | Status: DC

## 2019-03-09 NOTE — ED Triage Notes (Signed)
Pt reports vaginal discharge and odor x2 weeks.  Pt tried probiotics.  She denies any urinary issues or vaginal pain.

## 2019-03-09 NOTE — ED Provider Notes (Signed)
[facility]    CSN: [id number] Arrival date & time: 03/09/19  1014      History   Chief Complaint Chief Complaint  Patient presents with  . Vaginal Discharge    HPI Kayla Massey is a 23 y.o. female no past medical history comes to urgent care with a 2-week history of thin grayish pruritic vaginal discharge.  Started 2 weeks ago and is gotten progressively worse.  No known aggravating or relieving factors.  She tried over-the-counter probiotics with no improvement.  She denies douching.  No fever, chills.  No dyspareunia.   HPI  Past Medical History:  Diagnosis Date  . Ovarian cyst     There are no active problems to display for this patient.   History reviewed. No pertinent surgical history.  OB History   No obstetric history on file.      Home Medications    Prior to Admission medications   Medication Sig Start Date End Date Taking? Authorizing Provider  metroNIDAZOLE (FLAGYL) 500 MG tablet Take 1 tablet (500 mg total) by mouth 2 (two) times daily. 03/09/19   Telina Kleckley, Britta MccreedyPhilip O, MD  naproxen (NAPROSYN) 500 MG tablet Take 1 tablet (500 mg total) by mouth 2 (two) times daily. 06/05/18   Wallis BambergMani, Mario, PA-C    Family History Family History  Problem Relation Age of Onset  . Healthy Mother   . Healthy Father     Social History Social History   Tobacco Use  . Smoking status: Former Smoker    Types: Cigarettes  . Smokeless tobacco: Never Used  Substance Use Topics  . Alcohol use: Yes    Comment: socially   . Drug use: No     Allergies   Patient has no known allergies.   Review of Systems Review of Systems  Genitourinary: Positive for vaginal discharge. Negative for dyspareunia, dysuria, flank pain, frequency, genital sores, pelvic pain and urgency.  Musculoskeletal: Negative for arthralgias and back pain.  Neurological: Negative for dizziness, weakness and light-headedness.  Psychiatric/Behavioral: Negative.      Physical Exam Triage  Vital Signs ED Triage Vitals  Enc Vitals Group     BP 03/09/19 1038 120/86     Pulse Rate 03/09/19 1038 86     Resp 03/09/19 1038 12     Temp 03/09/19 1038 98.9 F (37.2 C)     Temp Source 03/09/19 1038 Oral     SpO2 03/09/19 1038 100 %     Weight --      Height --      Head Circumference --      Peak Flow --      Pain Score 03/09/19 1036 0     Pain Loc --      Pain Edu? --      Excl. in GC? --    No data found.  Updated Vital Signs BP 120/86 (BP Location: Left Arm)   Pulse 86   Temp 98.9 F (37.2 C) (Oral)   Resp 12   LMP 02/17/2019 (Exact Date)   SpO2 100%   Visual Acuity Right Eye Distance:   Left Eye Distance:   Bilateral Distance:    Right Eye Near:   Left Eye Near:    Bilateral Near:     Physical Exam Vitals signs and nursing note reviewed.  Constitutional:      Appearance: Normal appearance.  Cardiovascular:     Rate and Rhythm: Normal rate and regular rhythm.  Pulmonary:  Effort: Pulmonary effort is normal.     Breath sounds: Normal breath sounds.  Abdominal:     General: Bowel sounds are normal.     Palpations: Abdomen is soft.  Skin:    General: Skin is warm and dry.     Capillary Refill: Capillary refill takes less than 2 seconds.  Neurological:     General: No focal deficit present.     Mental Status: She is alert.      UC Treatments / Results  Labs (all labs ordered are listed, but only abnormal results are displayed) Labs Reviewed - No data to display  EKG None  Radiology No results found.  Procedures Procedures (including critical care time)  Medications Ordered in UC Medications - No data to display  Initial Impression / Assessment and Plan / UC Course  I have reviewed the triage vital signs and the nursing notes.  Pertinent labs & imaging results that were available during my care of the patient were reviewed by me and considered in my medical decision making (see chart for details).     1.  Bacterial vaginosis:  Metronidazole 500 mg twice daily for 7 days Patient is advised to return to urgent care if her symptoms does not resolve or if she develops any abdominal pain, nausea or vomiting. Final Clinical Impressions(s) / UC Diagnoses   Final diagnoses:  Bacterial vaginosis   Discharge Instructions   None    ED Prescriptions    Medication Sig Dispense Auth. Provider   metroNIDAZOLE (FLAGYL) 500 MG tablet Take 1 tablet (500 mg total) by mouth 2 (two) times daily. 14 tablet Tahir Blank, Myrene Galas, MD     Controlled Substance Prescriptions [place] Controlled Substance Registry consulted? No   Chase Picket, MD 03/09/19 1128

## 2019-03-12 ENCOUNTER — Encounter (HOSPITAL_COMMUNITY): Payer: Self-pay

## 2019-03-12 ENCOUNTER — Ambulatory Visit (HOSPITAL_COMMUNITY)
Admission: EM | Admit: 2019-03-12 | Discharge: 2019-03-12 | Disposition: A | Discharge status: Home / Self Care | Payer: PRIVATE HEALTH INSURANCE | Attending: Emergency Medicine | Admitting: Emergency Medicine

## 2019-03-12 DIAGNOSIS — M545 Low back pain, unspecified: Secondary | ICD-10-CM

## 2019-03-12 DIAGNOSIS — R51 Headache: Secondary | ICD-10-CM

## 2019-03-12 DIAGNOSIS — R519 Headache, unspecified: Secondary | ICD-10-CM

## 2019-03-12 MED ORDER — IBUPROFEN 800 MG PO TABS: 800.0000 mg | ORAL_TABLET | Freq: Three times a day (TID) | ORAL | 0 refills | Status: DC

## 2019-03-12 MED ORDER — CYCLOBENZAPRINE HCL 5 MG PO TABS: 5.0000 mg | ORAL_TABLET | Freq: Two times a day (BID) | ORAL | 0 refills | Status: DC | PRN

## 2019-03-12 NOTE — Discharge Instructions (Signed)
Use anti-inflammatories for pain/swelling. You may take up to 800 mg Ibuprofen every 8 hours with food. You may supplement Ibuprofen with Tylenol [phone] mg every 8 hours.   You may use flexeril as needed to help with pain. This is a muscle relaxer and causes sedation- please use only at bedtime or when you will be home and not have to drive/work  Alternate ice and heat Follow up if back pain not improving, worsening, developing numbness, tingling, leg weakness, issues controlling bowel/bladder
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
## 2019-03-13 ENCOUNTER — Telehealth (HOSPITAL_COMMUNITY): Payer: Self-pay | Admitting: Emergency Medicine

## 2019-03-13 MED ORDER — METRONIDAZOLE 500 MG PO TABS: 500.0000 mg | ORAL_TABLET | Freq: Two times a day (BID) | ORAL | 0 refills | Status: AC
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
## 2019-05-01 ENCOUNTER — Other Ambulatory Visit: Payer: Self-pay

## 2019-05-01 ENCOUNTER — Other Ambulatory Visit (HOSPITAL_COMMUNITY)
Admission: RE | Admit: 2019-05-01 | Discharge: 2019-05-01 | Disposition: A | Discharge status: Home / Self Care | Payer: PRIVATE HEALTH INSURANCE | Source: Ambulatory Visit | Attending: Family Medicine | Admitting: Family Medicine

## 2019-05-01 ENCOUNTER — Emergency Department (INDEPENDENT_AMBULATORY_CARE_PROVIDER_SITE_OTHER)
Admission: EM | Admit: 2019-05-01 | Discharge: 2019-05-01 | Disposition: A | Discharge status: Home / Self Care | Payer: PRIVATE HEALTH INSURANCE | Source: Home / Self Care

## 2019-05-01 DIAGNOSIS — N898 Other specified noninflammatory disorders of vagina: Secondary | ICD-10-CM | POA: Diagnosis not present

## 2019-05-01 DIAGNOSIS — N760 Acute vaginitis: Secondary | ICD-10-CM

## 2019-05-01 MED ORDER — FLUCONAZOLE 150 MG PO TABS: 150.0000 mg | ORAL_TABLET | Freq: Once | ORAL | 0 refills | Status: AC

## 2019-05-01 NOTE — ED Triage Notes (Signed)
pt had vaginal odor and discharge for about 1 week

## 2019-05-01 NOTE — Discharge Instructions (Signed)
°  You will be notified in about 3-4 days of your test results.  If more medication is indicated, it will be called into your preferred pharmacy.  Please follow up with family medicine in 1 week if not improving.

## 2019-05-01 NOTE — ED Provider Notes (Signed)
[facility]    CSN: [id number] Arrival date & time: 05/01/19  1140     History   Chief Complaint Chief Complaint  Patient presents with  . Vaginal Discharge    HPI Kayla Massey is a 23 y.o. female.   HPI Kayla Massey is a 23 y.o. female presenting to UC with c/o malodorous vaginal discharge and mild irritation for about 1 week. She recently completed a course of amoxicillin for a tooth infection. Denies fever, chills, n/v/d. Denies abdominal pain or back pain. No urinary symptoms. Denies concern for STIs. She has had yeast infections in the past, symptoms feel similar.    Past Medical History:  Diagnosis Date  . Ovarian cyst     There are no active problems to display for this patient.   History reviewed. No pertinent surgical history.  OB History   No obstetric history on file.      Home Medications    Prior to Admission medications   Medication Sig Start Date End Date Taking? Authorizing Provider  cyclobenzaprine (FLEXERIL) 5 MG tablet Take 1-2 tablets (5-10 mg total) by mouth 2 (two) times daily as needed for muscle spasms. 03/12/19   Wieters, Hallie C, PA-C  fluconazole (DIFLUCAN) 150 MG tablet Take 1 tablet (150 mg total) by mouth once for 1 dose. May repeat in 3 days if still symptomatic 05/01/19 05/01/19  Lurene ShadowPhelps, Aline Wesche O, PA-C  ibuprofen (ADVIL) 800 MG tablet Take 1 tablet (800 mg total) by mouth 3 (three) times daily. 03/12/19   Wieters, Junius CreamerHallie C, PA-C    Family History Family History  Problem Relation Age of Onset  . Healthy Mother   . Healthy Father     Social History Social History   Tobacco Use  . Smoking status: Former Smoker    Types: Cigarettes  . Smokeless tobacco: Never Used  Substance Use Topics  . Alcohol use: Yes    Comment: socially   . Drug use: No     Allergies   Patient has no known allergies.   Review of Systems Review of Systems  Constitutional: Negative for chills and fever.  Gastrointestinal: Negative  for abdominal pain, diarrhea, nausea and vomiting.  Genitourinary: Positive for vaginal discharge. Negative for dysuria, flank pain, frequency, genital sores, hematuria, vaginal bleeding and vaginal pain.  Skin: Negative for rash.     Physical Exam Triage Vital Signs ED Triage Vitals  Enc Vitals Group     BP 05/01/19 1225 125/86     Pulse Rate 05/01/19 1225 73     Resp 05/01/19 1225 20     Temp 05/01/19 1225 98.8 F (37.1 C)     Temp Source 05/01/19 1225 Oral     SpO2 05/01/19 1225 98 %     Weight 05/01/19 1226 148 lb (67.1 kg)     Height 05/01/19 1226 5\' 4"  (1.626 m)     Head Circumference --      Peak Flow --      Pain Score 05/01/19 1226 0     Pain Loc --      Pain Edu? --      Excl. in [place]? --    No data found.  Updated Vital Signs BP 125/86 (BP Location: Right Arm)   Pulse 73   Temp 98.8 F (37.1 C) (Oral)   Resp 20   Ht 5\' 4"  (1.626 m)   Wt 148 lb (67.1 kg)   LMP 04/10/2019   SpO2 98%   BMI 25.40  kg/m   Visual Acuity Right Eye Distance:   Left Eye Distance:   Bilateral Distance:    Right Eye Near:   Left Eye Near:    Bilateral Near:     Physical Exam Vitals signs and nursing note reviewed.  Constitutional:      Appearance: Normal appearance. She is well-developed.  HENT:     Head: Normocephalic and atraumatic.     Mouth/Throat:     Mouth: Mucous membranes are moist.  Neck:     Musculoskeletal: Normal range of motion.  Cardiovascular:     Rate and Rhythm: Normal rate and regular rhythm.  Pulmonary:     Effort: Pulmonary effort is normal.     Breath sounds: Normal breath sounds.  Abdominal:     General: There is no distension.     Palpations: Abdomen is soft.     Tenderness: There is no abdominal tenderness. There is no right CVA tenderness or left CVA tenderness.  Musculoskeletal: Normal range of motion.  Skin:    General: Skin is warm and dry.  Neurological:     Mental Status: She is alert and oriented to person, place, and time.   Psychiatric:        Behavior: Behavior normal.      UC Treatments / Results  Labs (all labs ordered are listed, but only abnormal results are displayed) Labs Reviewed  CERVICOVAGINAL ANCILLARY ONLY    EKG   Radiology No results found.  Procedures Procedures (including critical care time)  Medications Ordered in UC Medications - No data to display  Initial Impression / Assessment and Plan / UC Course  I have reviewed the triage vital signs and the nursing notes.  Pertinent labs & imaging results that were available during my care of the patient were reviewed by me and considered in my medical decision making (see chart for details).     Pt provided self-swab Swab sent to lab to test for BV and candidiasis  Due to recent antibiotic use and symptoms similar to prior yeast infection, will start pt on diflucan while results pending. AVS provided.  Final Clinical Impressions(s) / UC Diagnoses   Final diagnoses:  Vaginal discharge  Acute vaginitis     Discharge Instructions      You will be notified in about 3-4 days of your test results.  If more medication is indicated, it will be called into your preferred pharmacy.  Please follow up with family medicine in 1 week if not improving.     ED Prescriptions    Medication Sig Dispense Auth. Provider   fluconazole (DIFLUCAN) 150 MG tablet Take 1 tablet (150 mg total) by mouth once for 1 dose. May repeat in 3 days if still symptomatic 2 tablet Noe Gens, PA-C     Controlled Substance Prescriptions [place] Controlled Substance Registry consulted? Not Applicable   Tyrell Antonio 05/01/19 1242

## 2019-05-03 LAB — CERVICOVAGINAL ANCILLARY ONLY
Bacterial vaginitis: POSITIVE — AB
Candida vaginitis: NEGATIVE
Chlamydia: POSITIVE — AB
Neisseria Gonorrhea: NEGATIVE
Trichomonas: NEGATIVE

## 2019-05-04 ENCOUNTER — Telehealth: Payer: Self-pay

## 2019-05-04 MED ORDER — METRONIDAZOLE 500 MG PO TABS: 500.0000 mg | ORAL_TABLET | Freq: Two times a day (BID) | ORAL | 0 refills | Status: DC

## 2019-05-04 MED ORDER — AZITHROMYCIN 500 MG PO TABS: 500.0000 mg | ORAL_TABLET | Freq: Every day | ORAL | 0 refills | Status: DC

## 2019-05-04 NOTE — Telephone Encounter (Signed)
Notified patient of lab results and medication to treat sent to the pharmacy.

## 2019-09-09 ENCOUNTER — Encounter (HOSPITAL_COMMUNITY): Payer: Self-pay

## 2019-09-09 ENCOUNTER — Ambulatory Visit (HOSPITAL_COMMUNITY)
Admission: EM | Admit: 2019-09-09 | Discharge: 2019-09-09 | Disposition: A | Discharge status: Home / Self Care | Payer: PRIVATE HEALTH INSURANCE | Attending: Emergency Medicine | Admitting: Emergency Medicine

## 2019-09-09 ENCOUNTER — Other Ambulatory Visit: Payer: Self-pay

## 2019-09-09 DIAGNOSIS — T192XXA Foreign body in vulva and vagina, initial encounter: Secondary | ICD-10-CM | POA: Insufficient documentation

## 2019-09-09 DIAGNOSIS — N898 Other specified noninflammatory disorders of vagina: Secondary | ICD-10-CM | POA: Diagnosis present

## 2019-09-09 MED ORDER — METRONIDAZOLE 500 MG PO TABS: 500.0000 mg | ORAL_TABLET | Freq: Two times a day (BID) | ORAL | 0 refills | Status: AC

## 2019-09-09 NOTE — Discharge Instructions (Addendum)
No foreign body found May begin medicine for BV Swab results pending  Follow up if developing any concerns

## 2019-09-09 NOTE — ED Triage Notes (Signed)
Pt presents with foreign body in vagina (pt believes it to be a condom stuck) and vaginal odor X 4 days.

## 2019-09-10 NOTE — ED Provider Notes (Signed)
[facility]    CSN: [id number] Arrival date & time: 09/09/19  1512      History   Chief Complaint Chief Complaint  Patient presents with  . Appointment  . (3:30 Foreign Body in Vagina & Vaginal Odor)    HPI Kayla Massey is a 23 y.o. female history of prior ovarian cyst presenting today for evaluation of possible vaginal foreign body and vaginal odor.  Patient states that over the past 4 days she has noticed a vaginal odor.  She is unsure of if she could have a condom stuck in her vagina.  Patient states that after having recent intercourse she they could not find the condom.  She has not noticed any discharge.  She has had some occasional cramping, but is attributed this to ovulating.  She denies any nausea or vomiting.  Last menstrual cycle was 12/14.  She is not on any form of birth control.  Does have history of BV.  HPI  Past Medical History:  Diagnosis Date  . Ovarian cyst     There are no problems to display for this patient.   History reviewed. No pertinent surgical history.  OB History   No obstetric history on file.      Home Medications    Prior to Admission medications   Medication Sig Start Date End Date Taking? Authorizing Provider  cyclobenzaprine (FLEXERIL) 5 MG tablet Take 1-2 tablets (5-10 mg total) by mouth 2 (two) times daily as needed for muscle spasms. 03/12/19   Octavio Matheney C, PA-C  ibuprofen (ADVIL) 800 MG tablet Take 1 tablet (800 mg total) by mouth 3 (three) times daily. 03/12/19   Guss Farruggia C, PA-C  metroNIDAZOLE (FLAGYL) 500 MG tablet Take 1 tablet (500 mg total) by mouth 2 (two) times daily for 7 days. 09/09/19 09/16/19  Hasten Sweitzer, Elesa Hacker, PA-C    Family History Family History  Problem Relation Age of Onset  . Healthy Mother   . Healthy Father     Social History Social History   Tobacco Use  . Smoking status: Former Smoker    Types: Cigarettes  . Smokeless tobacco: Never Used  Substance Use Topics  . Alcohol  use: Yes    Comment: socially   . Drug use: No     Allergies   Patient has no known allergies.   Review of Systems Review of Systems  Constitutional: Negative for fever.  Respiratory: Negative for shortness of breath.   Cardiovascular: Negative for chest pain.  Gastrointestinal: Negative for abdominal pain, diarrhea, nausea and vomiting.  Genitourinary: Negative for dysuria, flank pain, genital sores, hematuria, menstrual problem, vaginal bleeding, vaginal discharge and vaginal pain.  Musculoskeletal: Negative for back pain.  Skin: Negative for rash.  Neurological: Negative for dizziness, light-headedness and headaches.     Physical Exam Triage Vital Signs ED Triage Vitals [09/09/19 1608]  Enc Vitals Group     BP (!) 148/87     Pulse Rate 78     Resp 16     Temp 98.4 F (36.9 C)     Temp Source Oral     SpO2 98 %     Weight      Height      Head Circumference      Peak Flow      Pain Score 0     Pain Loc      Pain Edu?      Excl. in [place]?    No data found.  Updated  Vital Signs BP (!) 148/87 (BP Location: Left Arm)   Pulse 78   Temp 98.4 F (36.9 C) (Oral)   Resp 16   SpO2 98%   Visual Acuity Right Eye Distance:   Left Eye Distance:   Bilateral Distance:    Right Eye Near:   Left Eye Near:    Bilateral Near:     Physical Exam Vitals and nursing note reviewed.  Constitutional:      Appearance: She is well-developed.     Comments: No acute distress  HENT:     Head: Normocephalic and atraumatic.     Nose: Nose normal.  Eyes:     Conjunctiva/sclera: Conjunctivae normal.  Cardiovascular:     Rate and Rhythm: Normal rate.  Pulmonary:     Effort: Pulmonary effort is normal. No respiratory distress.  Abdominal:     General: There is no distension.  Genitourinary:    Comments: Vaginal mucosa pink, no foreign body visualized in vagina, cervix pink without erythema, nonfriable.  Minimal discharge noted. Musculoskeletal:        General: Normal range  of motion.     Cervical back: Neck supple.  Skin:    General: Skin is warm and dry.  Neurological:     Mental Status: She is alert and oriented to person, place, and time.      UC Treatments / Results  Labs (all labs ordered are listed, but only abnormal results are displayed) Labs Reviewed  CERVICOVAGINAL ANCILLARY ONLY    EKG   Radiology No results found.  Procedures Procedures (including critical care time)  Medications Ordered in UC Medications - No data to display  Initial Impression / Assessment and Plan / UC Course  I have reviewed the triage vital signs and the nursing notes.  Pertinent labs & imaging results that were available during my care of the patient were reviewed by me and considered in my medical decision making (see chart for details).     No foreign body in vagina.  Given odor and history of BV will empirically treat for BV today, did go ahead and obtained vaginal swab to screen for STDs as well as confirm BV diagnosis.  Will call with results and alter treatment as needed.  Discussed strict return precautions. Patient verbalized understanding and is agreeable with plan.  Final Clinical Impressions(s) / UC Diagnoses   Final diagnoses:  Vaginal foreign body, initial encounter  Vaginal odor     Discharge Instructions     No foreign body found May begin medicine for BV Swab results pending  Follow up if developing any concerns   ED Prescriptions    Medication Sig Dispense Auth. Provider   metroNIDAZOLE (FLAGYL) 500 MG tablet Take 1 tablet (500 mg total) by mouth 2 (two) times daily for 7 days. 14 tablet Lourene Hoston, [place] C, PA-C     PDMP not reviewed this encounter.   Lew Dawes, [place] 09/10/19 [phone]

## 2019-09-12 LAB — CERVICOVAGINAL ANCILLARY ONLY
Bacterial vaginitis: POSITIVE — AB
Candida vaginitis: NEGATIVE
Chlamydia: NEGATIVE
Neisseria Gonorrhea: NEGATIVE
Trichomonas: NEGATIVE

## 2019-10-16 ENCOUNTER — Encounter: Payer: Self-pay | Admitting: Nurse Practitioner

## 2019-10-16 ENCOUNTER — Ambulatory Visit (INDEPENDENT_AMBULATORY_CARE_PROVIDER_SITE_OTHER): Payer: PRIVATE HEALTH INSURANCE | Admitting: Nurse Practitioner

## 2019-10-16 ENCOUNTER — Other Ambulatory Visit (HOSPITAL_COMMUNITY)
Admission: RE | Admit: 2019-10-16 | Discharge: 2019-10-16 | Disposition: A | Discharge status: Home / Self Care | Payer: PRIVATE HEALTH INSURANCE | Source: Ambulatory Visit | Attending: Nurse Practitioner | Admitting: Nurse Practitioner

## 2019-10-16 VITALS — BP 118/83 | HR 82 | Temp 98.4°F | Ht 62.75 in | Wt 148.8 lb

## 2019-10-16 DIAGNOSIS — N898 Other specified noninflammatory disorders of vagina: Secondary | ICD-10-CM | POA: Insufficient documentation

## 2019-10-16 DIAGNOSIS — Z01419 Encounter for gynecological examination (general) (routine) without abnormal findings: Secondary | ICD-10-CM | POA: Diagnosis not present

## 2019-10-16 LAB — WET PREP FOR TRICH, YEAST, CLUE
MICRO NUMBER:: 10101551
Specimen Quality: ADEQUATE

## 2019-10-16 MED ORDER — METRONIDAZOLE 500 MG PO TABS: 500.0000 mg | ORAL_TABLET | Freq: Two times a day (BID) | ORAL | 0 refills | Status: DC

## 2019-10-16 NOTE — Progress Notes (Signed)
Acute Office Visit  Subjective:    Patient ID: Kayla Massey, female    DOB: 1996-03-16, 24 y.o.   MRN: [id number]  Chief Complaint  Patient presents with  . Vaginal Discharge    HPI  Patient has not been seen in this office before today. She is a new patient establishing care with Dr. Sheppard Coil. She has an appointment for 10/26/2019 for physical. Issues addressed today are of acute nature.   Patient is in today for vaginal discharge and odor that has been present for approximately 1 week. She reports a "fishy" odor predominantly after intercourse and a thin, milky white discharge. She has a history of bacterial vaginosis and feels this is a recurrence of the same issue. She has never had a pap smear.   VAGINAL DISCHARGE Last sexual intercourse: this morning Protection: Condoms Duration: days Discharge description: white  Pruritus: no Dysuria: no Malodorous: worse after sex Urinary frequency: no Fevers: no Abdominal pain: yes  Sexual activity: monogamous History of sexually transmitted diseases: yes - Chlamydia two years ago Recent antibiotic use: no Context: recurrent BV  Treatments attempted: probiotic     Past Medical History:  Diagnosis Date  . Ovarian cyst     No past surgical history on file.  Family History  Problem Relation Age of Onset  . Healthy Mother   . Healthy Father     Social History   Socioeconomic History  . Marital status: Single    Spouse name: Not on file  . Number of children: Not on file  . Years of education: Not on file  . Highest education level: Not on file  Occupational History  . Not on file  Tobacco Use  . Smoking status: Former Smoker    Types: Cigarettes  . Smokeless tobacco: Never Used  Substance and Sexual Activity  . Alcohol use: Yes    Comment: socially   . Drug use: No  . Sexual activity: Not on file  Other Topics Concern  . Not on file  Social History Narrative  . Not on file   Social Determinants of Health    Financial Resource Strain:   . Difficulty of Paying Living Expenses: Not on file  Food Insecurity:   . Worried About Charity fundraiser in the Last Year: Not on file  . Ran Out of Food in the Last Year: Not on file  Transportation Needs:   . Lack of Transportation (Medical): Not on file  . Lack of Transportation (Non-Medical): Not on file  Physical Activity:   . Days of Exercise per Week: Not on file  . Minutes of Exercise per Session: Not on file  Stress:   . Feeling of Stress : Not on file  Social Connections:   . Frequency of Communication with Friends and Family: Not on file  . Frequency of Social Gatherings with Friends and Family: Not on file  . Attends Religious Services: Not on file  . Active Member of Clubs or Organizations: Not on file  . Attends Archivist Meetings: Not on file  . Marital Status: Not on file  Intimate Partner Violence:   . Fear of Current or Ex-Partner: Not on file  . Emotionally Abused: Not on file  . Physically Abused: Not on file  . Sexually Abused: Not on file    Outpatient Medications Prior to Visit  Medication Sig Dispense Refill  . cyclobenzaprine (FLEXERIL) 5 MG tablet Take 1-2 tablets (5-10 mg total) by mouth 2 (two) times daily  as needed for muscle spasms. 24 tablet 0  . ibuprofen (ADVIL) 800 MG tablet Take 1 tablet (800 mg total) by mouth 3 (three) times daily. 30 tablet 0   No facility-administered medications prior to visit.    No Known Allergies  Review of Systems  Constitutional: Negative for chills, fatigue and fever.  Gastrointestinal: Negative for abdominal pain, blood in stool, constipation, diarrhea, nausea and rectal pain.  Genitourinary: Positive for vaginal discharge. Negative for decreased urine volume, difficulty urinating, dyspareunia, dysuria, enuresis, flank pain, frequency, genital sores, hematuria, menstrual problem, pelvic pain, urgency and vaginal bleeding.  Skin: Negative for rash and wound.        Objective:    Physical Exam Exam conducted with a chaperone present.  Constitutional:      Appearance: Normal appearance. She is normal weight. She is not ill-appearing.  Cardiovascular:     Rate and Rhythm: Normal rate.     Pulses: Normal pulses.  Pulmonary:     Effort: Pulmonary effort is normal.  Abdominal:     General: Bowel sounds are normal. There is no distension.     Palpations: There is no mass.     Tenderness: There is no abdominal tenderness. There is no right CVA tenderness, left CVA tenderness, guarding or rebound.     Hernia: No hernia is present. There is no hernia in the left inguinal area or right inguinal area.  Genitourinary:    General: Normal vulva.     Exam position: Lithotomy position.     Pubic Area: No rash.      Labia:        Right: No rash, tenderness, lesion or injury.        Left: No rash, tenderness, lesion or injury.      Vagina: Vaginal discharge present. No erythema.     Cervix: Discharge present. No cervical motion tenderness, friability, erythema or cervical bleeding.     Uterus: Not tender.      Adnexa: Left adnexa normal.     Rectum: Normal.     Comments: Presence of milky, white discharge present in the vaginal canal and on the cervix.  Lymphadenopathy:     Lower Body: No right inguinal adenopathy. No left inguinal adenopathy.  Skin:    Capillary Refill: Capillary refill takes less than 2 seconds.  Neurological:     Mental Status: She is alert.     There were no vitals taken for this visit. Wt Readings from Last 3 Encounters:  05/01/19 148 lb (67.1 kg)  09/05/18 131 lb (59.4 kg)  01/18/18 145 lb (65.8 kg)    Health Maintenance Due  Topic Date Due  . HIV Screening  05/11/2010  . TETANUS/TDAP  05/11/2014  . PAP-Cervical Cytology Screening  05/11/2016  . PAP SMEAR-Modifier  05/11/2016  . INFLUENZA VACCINE  04/15/2019    There are no preventive care reminders to display for this patient.   No results found for: TSH Lab  Results  Component Value Date   WBC 6.4 01/18/2018   HGB 12.8 01/18/2018   HCT 38.4 01/18/2018   MCV 74.0 (L) 01/18/2018   PLT 352 01/18/2018   Lab Results  Component Value Date   NA 140 01/18/2018   K 3.6 01/18/2018   CO2 24 01/18/2018   GLUCOSE 94 01/18/2018   BUN 9 01/18/2018   CREATININE 0.79 01/18/2018   BILITOT 0.7 03/08/2017   ALKPHOS 58 03/08/2017   AST 15 03/08/2017   ALT 12 (L) 03/08/2017  PROT 7.5 03/08/2017   ALBUMIN 4.0 03/08/2017   CALCIUM 9.1 01/18/2018   ANIONGAP 8 01/18/2018   No results found for: CHOL No results found for: HDL No results found for: LDLCALC No results found for: TRIG No results found for: CHOLHDL No results found for: BRKV3X     Assessment & Plan:  1. Vaginal discharge Symptoms and presentation most correlate with recurrence of bacterial vaginosis. Wet prep and pap with evaluation for gonorrhea/chlamydia sent today.  Will prophylacticaly treat with metronidazole for 10 days for suspected recurrent BV. Patient educated on bacterial vaginosis and common reasons for recurrence. All questions answered.    Pt is scheduled for visit with Dr. Lyn Hollingshead 10/26/19 to establish care.  Will evaluate need for further treatment measures based on results of tests today.  - WET PREP FOR TRICH, YEAST, CLUE - Cytology - PAP - metroNIDAZOLE (FLAGYL) 500 MG tablet; Take 1 tablet (500 mg total) by mouth 2 (two) times daily.  Dispense: 20 tablet; Refill: 0  2. Encounter for cervical Pap smear with pelvic exam First pap performed today.  - Cytology - PAP   Tollie Eth, NP

## 2019-10-16 NOTE — Patient Instructions (Addendum)
Keep taking the pro-biotic, this can only help. Please let us know if it doesn't go away or if it gets worse.  Do not drink alcohol while taking the metronidazole, this can make you sick.  It was a pleasure meeting you today!  Bacterial Vaginosis  Bacterial vaginosis is an infection of the vagina. It happens when too many normal germs (healthy bacteria) grow in the vagina. This infection puts you at risk for infections from sex (STIs). Treating this infection can lower your risk for some STIs. You should also treat this if you are pregnant. It can cause your baby to be born Kayla Massey. Follow these instructions at home: Medicines  Take over-the-counter and prescription medicines only as told by your doctor.  Take or use your antibiotic medicine as told by your doctor. Do not stop taking or using it even if you start to feel better. General instructions  If you your sexual partner is a woman, tell her that you have this infection. She needs to get treatment if she has symptoms. If you have a female partner, he does not need to be treated.  During treatment: ? Avoid sex. ? Do not douche. ? Avoid alcohol as told. ? Avoid breastfeeding as told.  Drink enough fluid to keep your pee (urine) clear or pale yellow.  Keep your vagina and butt (rectum) clean. ? Wash the area with warm water every day. ? Wipe from front to back after you use the toilet.  Keep all follow-up visits as told by your doctor. This is important. Preventing this condition  Do not douche.  Use only warm water to wash around your vagina.  Use protection when you have sex. This includes: ? Latex condoms. ? Dental dams.  Limit how many people you have sex with. It is best to only have sex with the same person (be monogamous).  Get tested for STIs. Have your partner get tested.  Wear underwear that is cotton or lined with cotton.  Avoid tight pants and pantyhose. This is most important in summer.  Do not use any  products that have nicotine or tobacco in them. These include cigarettes and e-cigarettes. If you need help quitting, ask your doctor.  Do not use illegal drugs.  Limit how much alcohol you drink. Contact a doctor if:  Your symptoms do not get better, even after you are treated.  You have more discharge or pain when you pee (urinate).  You have a fever.  You have pain in your belly (abdomen).  You have pain with sex.  Your bleed from your vagina between periods. Summary  This infection happens when too many germs (bacteria) grow in the vagina.  Treating this condition can lower your risk for some infections from sex (STIs).  You should also treat this if you are pregnant. It can cause Kayla Massey (premature) birth.  Do not stop taking or using your antibiotic medicine even if you start to feel better. This information is not intended to replace advice given to you by your health care provider. Make sure you discuss any questions you have with your health care provider. Document Revised: 08/13/2017 Document Reviewed: 05/16/2016 Elsevier Patient Education  2020 [employer].

## 2019-10-17 NOTE — Progress Notes (Signed)
Pt has seen results on MyChart.

## 2019-10-23 LAB — CYTOLOGY - PAP
Chlamydia: NEGATIVE
Comment: NEGATIVE
Comment: NORMAL
Diagnosis: NEGATIVE
Neisseria Gonorrhea: NEGATIVE

## 2019-10-26 ENCOUNTER — Ambulatory Visit: Payer: PRIVATE HEALTH INSURANCE | Admitting: Osteopathic Medicine

## 2020-03-25 ENCOUNTER — Other Ambulatory Visit: Payer: Self-pay

## 2020-03-25 ENCOUNTER — Other Ambulatory Visit (HOSPITAL_COMMUNITY)
Admission: RE | Admit: 2020-03-25 | Discharge: 2020-03-25 | Disposition: A | Discharge status: Home / Self Care | Payer: PRIVATE HEALTH INSURANCE | Source: Ambulatory Visit | Attending: Family Medicine | Admitting: Family Medicine

## 2020-03-25 ENCOUNTER — Ambulatory Visit: Payer: Self-pay

## 2020-03-25 ENCOUNTER — Emergency Department (INDEPENDENT_AMBULATORY_CARE_PROVIDER_SITE_OTHER)
Admission: RE | Admit: 2020-03-25 | Discharge: 2020-03-25 | Disposition: A | Discharge status: Home / Self Care | Payer: PRIVATE HEALTH INSURANCE | Source: Ambulatory Visit

## 2020-03-25 VITALS — BP 118/79 | HR 92 | Temp 98.3°F | Resp 16

## 2020-03-25 DIAGNOSIS — N898 Other specified noninflammatory disorders of vagina: Secondary | ICD-10-CM | POA: Diagnosis not present

## 2020-03-25 DIAGNOSIS — N760 Acute vaginitis: Secondary | ICD-10-CM | POA: Diagnosis not present

## 2020-03-25 LAB — POCT URINALYSIS DIP (MANUAL ENTRY)
Bilirubin, UA: NEGATIVE
Glucose, UA: NEGATIVE mg/dL
Leukocytes, UA: NEGATIVE
Nitrite, UA: NEGATIVE
Protein Ur, POC: NEGATIVE mg/dL
Spec Grav, UA: >=1.03 — AB (ref 1.010–1.025)
Urobilinogen, UA: 1 E.U./dL
pH, UA: 6 (ref 5.0–8.0)

## 2020-03-25 MED ORDER — METRONIDAZOLE 500 MG PO TABS: 500.0000 mg | ORAL_TABLET | Freq: Two times a day (BID) | ORAL | 0 refills | Status: DC

## 2020-03-25 NOTE — ED Provider Notes (Addendum)
[facility]    CSN: [id number] Arrival date & time: 03/25/20  1501      History   Chief Complaint Chief Complaint  Patient presents with  . Appointment    3:00  . Vaginal Discharge    HPI Kayla Massey is a 24 y.o. female.   HPI  Kayla Massey is a 24 y.o. female presenting to UC with c/o 2 weeks of worsening malodorous vaginal discharge that is a light grey in color. Associated lower abdominal cramping. Denies concern for STIs but does not mind being tested for GC/chlamydia. Hx of BV.  LMP: 03/06/20. Denies concern for pregnancy but also notes she is not on birth control, has unprotected intercourse at times.    Past Medical History:  Diagnosis Date  . Ovarian cyst     Patient Active Problem List   Diagnosis Date Noted  . Vaginal discharge 10/16/2019  . Dysmenorrhea 04/11/2014  . Menstrual irregularity 04/11/2014    Past Surgical History:  Procedure Laterality Date  . NO PAST SURGERIES      OB History    Gravida  0   Para  0   Term  0   Preterm  0   AB  0   Living  0     SAB  0   TAB  0   Ectopic  0   Multiple  0   Live Births  0            Home Medications    Prior to Admission medications   Medication Sig Start Date End Date Taking? Authorizing Provider  metroNIDAZOLE (FLAGYL) 500 MG tablet Take 1 tablet (500 mg total) by mouth 2 (two) times daily. One po bid x 7 days 03/25/20   Lurene Shadow, PA-C    Family History Family History  Problem Relation Age of Onset  . Healthy Mother   . Healthy Father     Social History Social History   Tobacco Use  . Smoking status: Never Smoker  . Smokeless tobacco: Never Used  Vaping Use  . Vaping Use: Former  . Substances: Nicotine  Substance Use Topics  . Alcohol use: Yes    Comment: 1 drink/week, either wine, beer, liquor  . Drug use: No     Allergies   Patient has no known allergies.   Review of Systems Review of Systems  Constitutional: Negative for chills  and fever.  Gastrointestinal: Positive for abdominal pain. Negative for diarrhea, nausea and vomiting.  Genitourinary: Positive for vaginal discharge. Negative for dysuria, frequency, hematuria and vaginal pain.  Musculoskeletal: Negative for back pain.     Physical Exam Triage Vital Signs ED Triage Vitals  Enc Vitals Group     BP 03/25/20 1512 118/79     Pulse Rate 03/25/20 1512 92     Resp 03/25/20 1512 16     Temp 03/25/20 1512 98.3 F (36.8 C)     Temp Source 03/25/20 1512 Oral     SpO2 03/25/20 1512 99 %     Weight --      Height --      Head Circumference --      Peak Flow --      Pain Score 03/25/20 1510 0     Pain Loc --      Pain Edu? --      Excl. in GC? --    No data found.  Updated Vital Signs BP 118/79 (BP Location: Left Arm)   Pulse  92   Temp 98.3 F (36.8 C) (Oral)   Resp 16   LMP 03/06/2020   SpO2 99%   Visual Acuity Right Eye Distance:   Left Eye Distance:   Bilateral Distance:    Right Eye Near:   Left Eye Near:    Bilateral Near:     Physical Exam Vitals and nursing note reviewed. Exam conducted with a chaperone present.  Constitutional:      Appearance: Normal appearance. She is well-developed.  HENT:     Head: Normocephalic and atraumatic.  Cardiovascular:     Rate and Rhythm: Normal rate and regular rhythm.  Pulmonary:     Effort: Pulmonary effort is normal. No respiratory distress.     Breath sounds: Normal breath sounds.  Abdominal:     General: There is no distension.     Palpations: Abdomen is soft.     Tenderness: There is no abdominal tenderness.  Genitourinary:    Vagina: Vaginal discharge (thin white, malodorous ) present. No bleeding.     Cervix: Normal.     Uterus: Normal.      Adnexa: Right adnexa normal and left adnexa normal.  Musculoskeletal:        General: Normal range of motion.     Cervical back: Normal range of motion.  Skin:    General: Skin is warm and dry.  Neurological:     Mental Status: She is  alert and oriented to person, place, and time.  Psychiatric:        Behavior: Behavior normal.      UC Treatments / Results  Labs (all labs ordered are listed, but only abnormal results are displayed) Labs Reviewed  POCT URINALYSIS DIP (MANUAL ENTRY) - Abnormal; Notable for the following components:      Result Value   Clarity, UA hazy (*)    Ketones, POC UA small (15) (*)    Spec Grav, UA >=1.030 (*)    Blood, UA small (*)    All other components within normal limits  CERVICOVAGINAL ANCILLARY ONLY    EKG   Radiology No results found.  Procedures Procedures (including critical care time)  Medications Ordered in UC Medications - No data to display  Initial Impression / Assessment and Plan / UC Course  I have reviewed the triage vital signs and the nursing notes.  Pertinent labs & imaging results that were available during my care of the patient were reviewed by me and considered in my medical decision making (see chart for details).     Based on hx and exam, will start pt on flagyl empirically for BV Vaginal swab sent to lab Discussed and encouraged safe sex practices to help prevent unwanted pregnancy and STIs.  F/u PCP and/or GYN AVS given  Final Clinical Impressions(s) / UC Diagnoses   Final diagnoses:  Vaginal discharge   Discharge Instructions   None    ED Prescriptions    Medication Sig Dispense Auth. Provider   metroNIDAZOLE (FLAGYL) 500 MG tablet Take 1 tablet (500 mg total) by mouth 2 (two) times daily. One po bid x 7 days 14 tablet Lurene Shadow, [place]     PDMP not reviewed this encounter.   Lurene Shadow, PA-C 03/25/20 1638    Lurene Shadow, PA-C 03/25/20 1639

## 2020-03-25 NOTE — ED Triage Notes (Signed)
Patient presents to Urgent Care with complaints of vaginal discharge and odor since 2 weeks ago. Patient reports she has had BV in the past and thinks that is what she has today, metronidazole has worked in the past.  Pt denies concern for pregnancy.

## 2020-03-26 LAB — CERVICOVAGINAL ANCILLARY ONLY
Bacterial Vaginitis (gardnerella): POSITIVE — AB
Candida Glabrata: NEGATIVE
Candida Vaginitis: NEGATIVE
Chlamydia: NEGATIVE
Comment: NEGATIVE
Comment: NEGATIVE
Comment: NEGATIVE
Comment: NEGATIVE
Comment: NEGATIVE
Comment: NORMAL
Neisseria Gonorrhea: NEGATIVE
Trichomonas: NEGATIVE

## 2020-05-24 ENCOUNTER — Ambulatory Visit: Payer: Self-pay

## 2020-05-25 ENCOUNTER — Ambulatory Visit: Payer: Self-pay

## 2020-05-30 ENCOUNTER — Encounter: Payer: Self-pay | Admitting: Nurse Practitioner

## 2020-05-30 ENCOUNTER — Ambulatory Visit (INDEPENDENT_AMBULATORY_CARE_PROVIDER_SITE_OTHER): Payer: PRIVATE HEALTH INSURANCE | Admitting: Nurse Practitioner

## 2020-05-30 VITALS — BP 114/81 | HR 68 | Temp 98.3°F | Ht 62.75 in | Wt 155.3 lb

## 2020-05-30 DIAGNOSIS — B9689 Other specified bacterial agents as the cause of diseases classified elsewhere: Secondary | ICD-10-CM | POA: Diagnosis not present

## 2020-05-30 DIAGNOSIS — N760 Acute vaginitis: Secondary | ICD-10-CM | POA: Diagnosis not present

## 2020-05-30 LAB — WET PREP FOR TRICH, YEAST, CLUE
MICRO NUMBER:: 10958893
Specimen Quality: ADEQUATE

## 2020-05-30 MED ORDER — METRONIDAZOLE 500 MG PO TABS: 500.0000 mg | ORAL_TABLET | Freq: Two times a day (BID) | ORAL | 5 refills | Status: AC

## 2020-05-30 MED ORDER — BORIC ACID CRYS: CRYSTALS | 1 refills | Status: DC

## 2020-05-30 NOTE — Patient Instructions (Addendum)
Bacterial vaginosis can become a recurrent infection.  There are ways that you can help reduce your risk of recurrence with this. * Do not use douches or any soap inside the vagina * Do not use any perfumes or scented body washes or lotions on the external genitalia. * Wear cotton lined panties.  In some women despite all efforts bacterial vaginosis continues to recur.  There is a treatment option with boric acid suppositories that seems to help many women.  This medication has limited availability, but they prefer drugs in [facility] does have this.  I have printed a prescription for you to take to them if you would like to try this.  He will insert 1 capsule into the vagina at bedtime for 14 to 21 days in a row.  You can start this after the antibiotic treatment.  Probiotics may be helpful also.  Bacterial Vaginosis  Bacterial vaginosis is a vaginal infection that occurs when the normal balance of bacteria in the vagina is disrupted. It results from an overgrowth of certain bacteria. This is the most common vaginal infection among women ages 48-44. Because bacterial vaginosis increases your risk for STIs (sexually transmitted infections), getting treated can help reduce your risk for chlamydia, gonorrhea, herpes, and HIV (human immunodeficiency virus). Treatment is also important for preventing complications in pregnant women, because this condition can cause an Kayla Massey (premature) delivery. What are the causes? This condition is caused by an increase in harmful bacteria that are normally present in small amounts in the vagina. However, the reason that the condition develops is not fully understood. What increases the risk? The following factors may make you more likely to develop this condition:  Having a new sexual partner or multiple sexual partners.  Having unprotected sex.  Douching.  Having an intrauterine device (IUD).  Smoking.  Drug and alcohol abuse.  Taking certain antibiotic  medicines.  Being pregnant. You cannot get bacterial vaginosis from toilet seats, bedding, swimming pools, or contact with objects around you. What are the signs or symptoms? Symptoms of this condition include:  Grey or white vaginal discharge. The discharge can also be watery or foamy.  A fish-like odor with discharge, especially after sexual intercourse or during menstruation.  Itching in and around the vagina.  Burning or pain with urination. Some women with bacterial vaginosis have no signs or symptoms. How is this diagnosed? This condition is diagnosed based on:  Your medical history.  A physical exam of the vagina.  Testing a sample of vaginal fluid under a microscope to look for a large amount of bad bacteria or abnormal cells. Your health care provider may use a cotton swab or a small wooden spatula to collect the sample. How is this treated? This condition is treated with antibiotics. These may be given as a pill, a vaginal cream, or a medicine that is put into the vagina (suppository). If the condition comes back after treatment, a second round of antibiotics may be needed. Follow these instructions at home: Medicines  Take over-the-counter and prescription medicines only as told by your health care provider.  Take or use your antibiotic as told by your health care provider. Do not stop taking or using the antibiotic even if you start to feel better. General instructions  If you have a female sexual partner, tell her that you have a vaginal infection. She should see her health care provider and be treated if she has symptoms. If you have a female sexual partner, he  does not need treatment.  During treatment: ? Avoid sexual activity until you finish treatment. ? Do not douche. ? Avoid alcohol as directed by your health care provider. ? Avoid breastfeeding as directed by your health care provider.  Drink enough water and fluids to keep your urine clear or pale  yellow.  Keep the area around your vagina and rectum clean. ? Wash the area daily with warm water. ? Wipe yourself from front to back after using the toilet.  Keep all follow-up visits as told by your health care provider. This is important. How is this prevented?  Do not douche.  Wash the outside of your vagina with warm water only.  Use protection when having sex. This includes latex condoms and dental dams.  Limit how many sexual partners you have. To help prevent bacterial vaginosis, it is best to have sex with just one partner (monogamous).  Make sure you and your sexual partner are tested for STIs.  Wear cotton or cotton-lined underwear.  Avoid wearing tight pants and pantyhose, especially during summer.  Limit the amount of alcohol that you drink.  Do not use any products that contain nicotine or tobacco, such as cigarettes and e-cigarettes. If you need help quitting, ask your health care provider.  Do not use illegal drugs. Where to find more information  Centers for Disease Control and Prevention: SolutionApps.co.za  American Sexual Health Association (ASHA): www.ashastd.org  U.S. Department of Health and Health and safety inspector, Office on Women's Health: ConventionalMedicines.si or http://www.anderson-williamson.info/ Contact a health care provider if:  Your symptoms do not improve, even after treatment.  You have more discharge or pain when urinating.  You have a fever.  You have pain in your abdomen.  You have pain during sex.  You have vaginal bleeding between periods. Summary  Bacterial vaginosis is a vaginal infection that occurs when the normal balance of bacteria in the vagina is disrupted.  Because bacterial vaginosis increases your risk for STIs (sexually transmitted infections), getting treated can help reduce your risk for chlamydia, gonorrhea, herpes, and HIV (human immunodeficiency virus). Treatment is also important for preventing  complications in pregnant women, because the condition can cause an Kayla Massey (premature) delivery.  This condition is treated with antibiotic medicines. These may be given as a pill, a vaginal cream, or a medicine that is put into the vagina (suppository). This information is not intended to replace advice given to you by your health care provider. Make sure you discuss any questions you have with your health care provider. Document Revised: 08/13/2017 Document Reviewed: 05/16/2016 Elsevier Patient Education  2020 [employer].

## 2020-05-30 NOTE — Progress Notes (Signed)
Charlena,  Vaginal swab came back and it was negative for yeast and clue cells.  Often times bacterial vaginosis is not caught on the swabs though.  I would suggest continue with the antibiotic and the boric acid.  If your symptoms persist beyond that let me know.

## 2020-05-30 NOTE — Assessment & Plan Note (Addendum)
Symptoms and presentation consistent with bacterial vaginosis.  Patient provided self swab today and vaginal exam was deferred.  We will not test for additional STIs today as there is no indication for repeat testing given low risk. We will plan to begin treatment with metronidazole for 7 days. We will also recommend treatment with boric acid capsules for 14 days to 21 days to help prevent recurrence.  Discussed additional over-the-counter treatments that may be helpful including Replens vaginal suppositories and probiotics. Wet prep was sent today. Follow-up if symptoms worsen or fail to improve.

## 2020-05-30 NOTE — Progress Notes (Signed)
Acute Office Visit  Subjective:    Patient ID: Kayla Massey, female    DOB: June 27, 1996, 24 y.o.   MRN: [id number]  Chief Complaint  Patient presents with  . Vaginal Discharge    onset 1 week, gray discharge, odor, recurrent    HPI Patient is in today for vaginal discharge thin and gray in color with an odor present that have been going on for about a week now. 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  She denies any new sexual partners or any history of STIs or risk of STIs at this time.  She is frustrated at the recurrence of her symptoms so frequently.  She reports the metronidazole is effective in clearing the bacteria and she feels better after using it but the symptoms return in about 2 months.  She has tried over-the-counter treatment with boric acid and felt that this was somewhat effective but she has not tried prescription dosing of this in the past.  She denies fever, chills, nausea, vomiting, abdominal pain, pelvic pain, irregular menstrual bleeding.  Past Medical History:  Diagnosis Date  . Ovarian cyst     Past Surgical History:  Procedure Laterality Date  . NO PAST SURGERIES      Family History  Problem Relation Age of Onset  . Healthy Mother   . Healthy Father     Social History   Socioeconomic History  . Marital status: Single    Spouse name: Not on file  . Number of children: Not on file  . Years of education: Not on file  . Highest education level: Not on file  Occupational History  . Not on file  Tobacco Use  . Smoking status: Never Smoker  . Smokeless tobacco: Never Used  Vaping Use  . Vaping Use: Former  . Substances: Nicotine  Substance and Sexual Activity  . Alcohol use: Yes    Comment: 1 drink/week, either wine, beer, liquor  . Drug use: No  . Sexual activity: Yes    Partners: Male    Birth control/protection: None, Condom  Other Topics Concern  . Not on file   Social History Narrative  . Not on file   Social Determinants of Health   Financial Resource Strain:   . Difficulty of Paying Living Expenses: Not on file  Food Insecurity:   . Worried About Programme researcher, broadcasting/film/video in the Last Year: Not on file  . Ran Out of Food in the Last Year: Not on file  Transportation Needs:   . Lack of Transportation (Medical): Not on file  . Lack of Transportation (Non-Medical): Not on file  Physical Activity:   . Days of Exercise per Week: Not on file  . Minutes of Exercise per Session: Not on file  Stress:   . Feeling of Stress : Not on file  Social Connections:   . Frequency of Communication with Friends and Family: Not on file  . Frequency of Social Gatherings with Friends and Family: Not on file  . Attends Religious Services: Not on file  . Active Member of Clubs or Organizations: Not on file  . Attends Banker Meetings: Not on file  . Marital Status: Not on file  Intimate Partner Violence:   . Fear of Current or Ex-Partner: Not on file  . Emotionally Abused: Not on file  . Physically Abused: Not on file  . Sexually Abused: Not on file    Outpatient  Medications Prior to Visit  Medication Sig Dispense Refill  . metroNIDAZOLE (FLAGYL) 500 MG tablet Take 1 tablet (500 mg total) by mouth 2 (two) times daily. One po bid x 7 days (Patient not taking: Reported on 05/30/2020) 14 tablet 0   No facility-administered medications prior to visit.    No Known Allergies  Review of Systems See HPI for pertinent positives and negatives.    Objective:    Physical Exam Vitals and nursing note reviewed.  Constitutional:      Appearance: Normal appearance.  HENT:     Head: Normocephalic.  Eyes:     Extraocular Movements: Extraocular movements intact.     Conjunctiva/sclera: Conjunctivae normal.     Pupils: Pupils are equal, round, and reactive to light.  Cardiovascular:     Rate and Rhythm: Normal rate and regular rhythm.     Pulses:  Normal pulses.     Heart sounds: Normal heart sounds.  Pulmonary:     Effort: Pulmonary effort is normal.     Breath sounds: Normal breath sounds.  Abdominal:     General: Abdomen is flat. Bowel sounds are normal. There is no distension.     Palpations: Abdomen is soft.     Tenderness: There is no abdominal tenderness. There is no right CVA tenderness or left CVA tenderness.  Musculoskeletal:        General: Normal range of motion.     Cervical back: Normal range of motion.  Skin:    General: Skin is warm and dry.     Capillary Refill: Capillary refill takes less than 2 seconds.  Neurological:     General: No focal deficit present.     Mental Status: She is alert and oriented to person, place, and time.  Psychiatric:        Mood and Affect: Mood normal.        Behavior: Behavior normal.        Thought Content: Thought content normal.        Judgment: Judgment normal.     BP 114/81   Pulse 68   Temp 98.3 F (36.8 C) (Oral)   Ht 5' 2.75" (1.594 m)   Wt 155 lb 4.8 oz (70.4 kg)   LMP 05/23/2020   SpO2 98%   BMI 27.73 kg/m  Wt Readings from Last 3 Encounters:  05/30/20 155 lb 4.8 oz (70.4 kg)  10/16/19 148 lb 12.8 oz (67.5 kg)  05/01/19 148 lb (67.1 kg)    There are no preventive care reminders to display for this patient.  There are no preventive care reminders to display for this patient.   No results found for: TSH Lab Results  Component Value Date   WBC 6.4 01/18/2018   HGB 12.8 01/18/2018   HCT 38.4 01/18/2018   MCV 74.0 (L) 01/18/2018   PLT 352 01/18/2018   Lab Results  Component Value Date   NA 140 01/18/2018   K 3.6 01/18/2018   CO2 24 01/18/2018   GLUCOSE 94 01/18/2018   BUN 9 01/18/2018   CREATININE 0.79 01/18/2018   BILITOT 0.7 03/08/2017   ALKPHOS 58 03/08/2017   AST 15 03/08/2017   ALT 12 (L) 03/08/2017   PROT 7.5 03/08/2017   ALBUMIN 4.0 03/08/2017   CALCIUM 9.1 01/18/2018   ANIONGAP 8 01/18/2018   No results found for: CHOL No  results found for: HDL No results found for: LDLCALC No results found for: TRIG No results found for: CHOLHDL No results found  for: HGBA1C     Assessment & Plan:   Problem List Items Addressed This Visit      Genitourinary   Bacterial vaginosis - Primary    Symptoms and presentation consistent with bacterial vaginosis.  Patient provided self swab today and vaginal exam was deferred.  We will not test for additional STIs today as there is no indication for repeat testing given low risk. We will plan to begin treatment with metronidazole for 7 days. We will also recommend treatment with boric acid capsules for 14 days to 21 days to help prevent recurrence.  Discussed additional over-the-counter treatments that may be helpful including Replens vaginal suppositories and probiotics. Wet prep was sent today. Follow-up if symptoms worsen or fail to improve.      Relevant Medications   metroNIDAZOLE (FLAGYL) 500 MG tablet   Other Relevant Orders   WET PREP FOR TRICH, YEAST, CLUE       Meds ordered this encounter  Medications  . Boric Acid CRYS    Sig: 600 mg vaginally qhs x 14-21 days. Dips: qs 21 days  [facility] [address], [place], [place] [address] Phone: [phone]    Dispense:  12000 g    Refill:  1    Please provide capsules for insertion if not supplied in this manner.  . metroNIDAZOLE (FLAGYL) 500 MG tablet    Sig: Take 1 tablet (500 mg total) by mouth 2 (two) times daily for 7 days.    Dispense:  14 tablet    Refill:  5   Follow-up if symptoms worsen or fail to improve.  Tollie Eth, NP

## 2020-11-19 ENCOUNTER — Ambulatory Visit: Payer: Self-pay

## 2020-11-20 ENCOUNTER — Other Ambulatory Visit (HOSPITAL_COMMUNITY)
Admission: RE | Admit: 2020-11-20 | Discharge: 2020-11-20 | Disposition: A | Discharge status: Home / Self Care | Payer: PRIVATE HEALTH INSURANCE | Source: Ambulatory Visit | Attending: Family Medicine | Admitting: Family Medicine

## 2020-11-20 ENCOUNTER — Emergency Department (INDEPENDENT_AMBULATORY_CARE_PROVIDER_SITE_OTHER)
Admission: RE | Admit: 2020-11-20 | Discharge: 2020-11-20 | Disposition: A | Discharge status: Home / Self Care | Payer: PRIVATE HEALTH INSURANCE | Source: Ambulatory Visit | Attending: Family Medicine | Admitting: Family Medicine

## 2020-11-20 ENCOUNTER — Other Ambulatory Visit: Payer: Self-pay

## 2020-11-20 VITALS — BP 128/88 | HR 84 | Temp 98.0°F | Resp 20 | Ht 64.0 in | Wt 148.0 lb

## 2020-11-20 DIAGNOSIS — N898 Other specified noninflammatory disorders of vagina: Secondary | ICD-10-CM

## 2020-11-20 DIAGNOSIS — R1030 Lower abdominal pain, unspecified: Secondary | ICD-10-CM

## 2020-11-20 DIAGNOSIS — N926 Irregular menstruation, unspecified: Secondary | ICD-10-CM | POA: Diagnosis not present

## 2020-11-20 HISTORY — DX: Acute vaginitis: B96.89

## 2020-11-20 LAB — POCT URINALYSIS DIP (MANUAL ENTRY)
Bilirubin, UA: NEGATIVE
Glucose, UA: NEGATIVE mg/dL
Ketones, POC UA: NEGATIVE mg/dL
Leukocytes, UA: NEGATIVE
Nitrite, UA: NEGATIVE
Protein Ur, POC: NEGATIVE mg/dL
Spec Grav, UA: 1.02 (ref 1.010–1.025)
Urobilinogen, UA: 0.2 E.U./dL
pH, UA: 7 (ref 5.0–8.0)

## 2020-11-20 LAB — POCT URINE PREGNANCY: Preg Test, Ur: NEGATIVE

## 2020-11-20 MED ORDER — METRONIDAZOLE 500 MG PO TABS: ORAL_TABLET | ORAL | 0 refills | Status: DC

## 2020-11-20 NOTE — Discharge Instructions (Signed)
Try taking a probiotic.  If symptoms become significantly worse during the night or over the weekend, proceed to the local emergency room.

## 2020-11-20 NOTE — ED Triage Notes (Signed)
Pt presents to Urgent Care with c/o lower abdominal cramping and vaginal "spotting outside of period" x approx 2 days. She has also noticed a foul odor recently and believes she may have bacterial vaginosis, which she reports is common for her.

## 2020-11-20 NOTE — ED Provider Notes (Signed)
[facility]    CSN: [id number] Arrival date & time: 11/20/20  1703      History   Chief Complaint Chief Complaint  Patient presents with  . Abdominal Pain  . Vaginal Bleeding    HPI Kayla Massey is a 25 y.o. female.   Patient began developing a scant malodorous vaginal discharge about one week ago.  Two days ago she developed mild lower abdominal cramping and spotting without urinary symptoms.  She denies nausea/vomiting and fevers, chills, and sweats. Patient's last menstrual period was 11/02/2020 (exact date).  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  She doubts STD, but would like to be checked for GC/chlamydia.  The history is provided by the patient.  Vaginal Discharge Quality:  Malodorous, thin and gray Severity:  Mild Onset quality:  Gradual Duration:  1 week Timing:  Intermittent Progression:  Worsening Chronicity:  Recurrent Context: spontaneously   Relieved by:  None tried Worsened by:  Nothing Ineffective treatments:  None tried Associated symptoms: no abdominal pain, no dysuria, no fever, no genital lesions, no nausea, no rash, no urinary frequency, no urinary hesitancy, no urinary incontinence, no vaginal itching and no vomiting   Risk factors: no STI exposure     Past Medical History:  Diagnosis Date  . BV (bacterial vaginosis)   . Ovarian cyst     Patient Active Problem List   Diagnosis Date Noted  . Bacterial vaginosis 05/30/2020  . Vaginal discharge 10/16/2019  . Dysmenorrhea 04/11/2014  . Menstrual irregularity 04/11/2014    Past Surgical History:  Procedure Laterality Date  . NO PAST SURGERIES      OB History    Gravida  0   Para  0   Term  0   Preterm  0   AB  0   Living  0     SAB  0   IAB  0   Ectopic  0   Multiple  0   Live Births  0            Home Medications    Prior to Admission medications   Medication Sig Start Date End Date Taking? Authorizing Provider   metroNIDAZOLE (FLAGYL) 500 MG tablet Take one tab by mouth every 12 hours for 7 days. 11/20/20  Yes Lattie Haw, MD  Boric Acid CRYS 600 mg vaginally qhs x 14-21 days. Dips: qs 21 days  [facility] [address] [place], [place] [address] Phone: [phone] 05/30/20   Early, Sung Amabile, NP    Family History Family History  Problem Relation Age of Onset  . Healthy Mother   . Healthy Father     Social History Social History   Tobacco Use  . Smoking status: Never Smoker  . Smokeless tobacco: Never Used  Vaping Use  . Vaping Use: Former  . Substances: Nicotine  Substance Use Topics  . Alcohol use: Yes    Comment: monthly  . Drug use: No     Allergies   Patient has no known allergies.   Review of Systems Review of Systems  Constitutional: Negative for activity change, appetite change, chills, diaphoresis, fatigue and fever.  Gastrointestinal: Negative for abdominal pain, nausea and vomiting.  Genitourinary: Positive for vaginal bleeding and vaginal discharge. Negative for bladder incontinence, dysuria, flank pain, frequency, genital sores, hematuria, hesitancy, pelvic pain, urgency and vaginal pain.  All other systems reviewed and are negative.    Physical Exam Triage  Vital Signs ED Triage Vitals  Enc Vitals Group     BP 11/20/20 1722 128/88     Pulse Rate 11/20/20 1722 84     Resp 11/20/20 1722 20     Temp 11/20/20 1722 98 F (36.7 C)     Temp Source 11/20/20 1722 Oral     SpO2 11/20/20 1722 100 %     Weight 11/20/20 1717 148 lb (67.1 kg)     Height 11/20/20 1717 5\' 4"  (1.626 m)     Head Circumference --      Peak Flow --      Pain Score 11/20/20 1717 0     Pain Loc --      Pain Edu? --      Excl. in GC? --    No data found.  Updated Vital Signs BP 128/88 (BP Location: Right Arm)   Pulse 84   Temp 98 F (36.7 C) (Oral)   Resp 20   Ht 5\' 4"  (1.626 m)   Wt 67.1 kg   LMP 11/02/2020 (Exact Date) Comment: Pt states she "doesn't think so" when  asked if there was a chance she was pregnant.  SpO2 100%   BMI 25.40 kg/m   Visual Acuity Right Eye Distance:   Left Eye Distance:   Bilateral Distance:    Right Eye Near:   Left Eye Near:    Bilateral Near:     Physical Exam  Nursing notes and Vital Signs reviewed. Appearance:  Patient appears stated age, and in no acute distress.    Eyes:  Pupils are equal, round, and reactive to light and accomodation.  Extraocular movement is intact.  Conjunctivae are not inflamed   Pharynx:  Normal; moist mucous membranes  Neck:  Supple.  No adenopathy Lungs:  Clear to auscultation.  Breath sounds are equal.  Moving air well. Heart:  Regular rate and rhythm without murmurs, rubs, or gallops.  Abdomen:  Nontender without masses or hepatosplenomegaly.  Bowel sounds are present.  No CVA or flank tenderness.  Extremities:  No edema.  Skin:  No rash present.    Pelvic exam deferred.  Patient self-obtained vaginal specimen.  UC Treatments / Results  Labs (all labs ordered are listed, but only abnormal results are displayed) Labs Reviewed  POCT URINALYSIS DIP (MANUAL ENTRY) - Abnormal; Notable for the following components:      Result Value   Blood, UA trace-intact (*)    All other components within normal limits  POCT URINE PREGNANCY negative  CERVICOVAGINAL ANCILLARY ONLY    EKG   Radiology No results found.  Procedures Procedures (including critical care time)  Medications Ordered in UC Medications - No data to display  Initial Impression / Assessment and Plan / UC Course  I have reviewed the triage vital signs and the nursing notes.  Pertinent labs & imaging results that were available during my care of the patient were reviewed by me and considered in my medical decision making (see chart for details).    Cervicovaginal ancillary pending. Begin empiric metronidazole for BV. Followup with Family Doctor if not improved in one week.    Final Clinical Impressions(s) / UC  Diagnoses   Final diagnoses:  Lower abdominal pain  Vaginal discharge     Discharge Instructions     Try taking a probiotic.  If symptoms become significantly worse during the night or over the weekend, proceed to the local emergency room.    ED Prescriptions    Medication  Sig Dispense Auth. Provider   metroNIDAZOLE (FLAGYL) 500 MG tablet Take one tab by mouth every 12 hours for 7 days. 14 tablet Lattie Haw, MD        Lattie Haw, MD 11/21/20 [phone]

## 2020-11-21 LAB — CERVICOVAGINAL ANCILLARY ONLY
Bacterial Vaginitis (gardnerella): POSITIVE — AB
Candida Glabrata: NEGATIVE
Candida Vaginitis: POSITIVE — AB
Chlamydia: NEGATIVE
Comment: NEGATIVE
Comment: NEGATIVE
Comment: NEGATIVE
Comment: NEGATIVE
Comment: NEGATIVE
Comment: NORMAL
Neisseria Gonorrhea: NEGATIVE
Trichomonas: NEGATIVE

## 2020-11-25 ENCOUNTER — Telehealth (HOSPITAL_COMMUNITY): Payer: Self-pay | Admitting: Emergency Medicine

## 2020-11-25 MED ORDER — FLUCONAZOLE 150 MG PO TABS: 150.0000 mg | ORAL_TABLET | Freq: Once | ORAL | 0 refills | Status: AC

## 2021-01-15 ENCOUNTER — Other Ambulatory Visit: Payer: Self-pay

## 2021-01-15 ENCOUNTER — Encounter: Payer: Self-pay | Admitting: Osteopathic Medicine

## 2021-01-15 ENCOUNTER — Ambulatory Visit (INDEPENDENT_AMBULATORY_CARE_PROVIDER_SITE_OTHER): Payer: PRIVATE HEALTH INSURANCE | Admitting: Osteopathic Medicine

## 2021-01-15 VITALS — BP 131/83 | HR 108 | Temp 98.3°F | Wt 154.0 lb

## 2021-01-15 DIAGNOSIS — N760 Acute vaginitis: Secondary | ICD-10-CM

## 2021-01-15 DIAGNOSIS — Z113 Encounter for screening for infections with a predominantly sexual mode of transmission: Secondary | ICD-10-CM

## 2021-01-15 DIAGNOSIS — B9689 Other specified bacterial agents as the cause of diseases classified elsewhere: Secondary | ICD-10-CM

## 2021-01-15 DIAGNOSIS — Z23 Encounter for immunization: Secondary | ICD-10-CM | POA: Diagnosis not present

## 2021-01-15 MED ORDER — CLINDAMYCIN HCL 300 MG PO CAPS: 300.0000 mg | ORAL_CAPSULE | Freq: Three times a day (TID) | ORAL | 0 refills | Status: DC

## 2021-01-15 MED ORDER — BORIC ACID CRYS: CRYSTALS | 1 refills | Status: AC

## 2021-01-15 NOTE — Progress Notes (Signed)
Kayla Massey is a 25 y.o. female who presents to  [facility]  today, 01/15/21, seeking care for the following:  . Vaginal discharge - concern for recurrent BV. Metrogel seems to "coat it" but doesn't really fix it. Also Rx Flagyl po last taken 11/2020. Boric Acid crystals prescribed 05/2020 but unable to get this . Contraception - would like to discuss. Options      ASSESSMENT & PLAN with other pertinent findings:  The primary encounter diagnosis was Bacterial vaginosis. Diagnoses of Need for Tdap vaccination and Screening for STDs (sexually transmitted diseases) were also pertinent to this visit.    Patient Instructions  Plan: Rx for clindamycin antibiotic If this isn't helping, please fill and use the Rx for Boric Acid capsules  If THAT isn't helping, call me!    Orders Placed This Encounter  Procedures  . C. trachomatis/N. gonorrhoeae RNA  . WET PREP FOR [place], YEAST, CLUE  . Tdap vaccine greater than or equal to 7yo IM    Meds ordered this encounter  Medications  . clindamycin (CLEOCIN) 300 MG capsule    Sig: Take 1 capsule (300 mg total) by mouth 3 (three) times daily for 7 days.    Dispense:  21 capsule    Refill:  0  . Boric Acid CRYS    Sig: 600 mg vaginally qhs x 14-21 days. Dips: qs 21 days  [facility] [address], [place], [place] [address] Phone: [phone]    Dispense:  12000 g    Refill:  1    Please provide capsules for insertion if not supplied in this manner.     See below for relevant physical exam findings  See below for recent lab and imaging results reviewed  Medications, allergies, PMH, PSH, SocH, FamH reviewed below    Follow-up instructions: Return for RECHECK PENDING RESULTS / IF WORSE OR CHANGE.                                        Exam:  BP 131/83 (BP Location: Left Arm, Patient Position: Sitting, Cuff Size: Normal)    Pulse (!) 108   Temp 98.3 F (36.8 C) (Oral)   Wt 154 lb 0.6 oz (69.9 kg)   BMI 26.44 kg/m   Constitutional: VS see above. General Appearance: alert, well-developed, well-nourished, NAD  Neck: No masses, trachea midline.   Respiratory: Normal respiratory effort.   Musculoskeletal: Gait normal. Symmetric and independent movement of all extremities  Neurological: Normal balance/coordination. No tremor.  Skin: warm, dry, intact.   Psychiatric: Normal judgment/insight. Normal mood and affect. Oriented x3.   Current Meds  Medication Sig  . clindamycin (CLEOCIN) 300 MG capsule Take 1 capsule (300 mg total) by mouth 3 (three) times daily for 7 days.    No Known Allergies  Patient Active Problem List   Diagnosis Date Noted  . Bacterial vaginosis 05/30/2020  . Vaginal discharge 10/16/2019  . Dysmenorrhea 04/11/2014  . Menstrual irregularity 04/11/2014    Family History  Problem Relation Age of Onset  . Healthy Mother   . Healthy Father     Social History   Tobacco Use  Smoking Status Never Smoker  Smokeless Tobacco Never Used    Past Surgical History:  Procedure Laterality Date  . NO PAST SURGERIES      Immunization History  Administered  Date(s) Administered  . HPV Quadrivalent 04/17/2013  . Hepatitis B, ped/adol 05/27/1995, 07/12/1995, 10/28/1995  . Influenza-Unspecified 09/11/2020  . MMR 05/23/1997, 01/27/2000  . Meningococcal Conjugate 04/17/2013  . Moderna Sars-Covid-2 Vaccination 09/08/2020, 10/06/2020  . Tdap 11/10/2007, 01/15/2021  . Varicella 05/23/1997, 11/14/2008    Recent Results (from the past 2160 hour(s))  POCT urinalysis dipstick     Status: Abnormal   Collection Time: 11/20/20  5:31 PM  Result Value Ref Range   Color, UA yellow yellow   Clarity, UA clear clear   Glucose, UA negative negative mg/dL   Bilirubin, UA negative negative   Ketones, POC UA negative negative mg/dL   Spec Grav, UA 1.020 1.010 - 1.025   Blood, UA trace-intact  (A) negative   pH, UA 7.0 5.0 - 8.0   Protein Ur, POC negative negative mg/dL   Urobilinogen, UA 0.2 0.2 or 1.0 E.U./dL   Nitrite, UA Negative Negative   Leukocytes, UA Negative Negative  POCT urine pregnancy     Status: None   Collection Time: 11/20/20  5:38 PM  Result Value Ref Range   Preg Test, Ur Negative Negative  Cervicovaginal ancillary only     Status: Abnormal   Collection Time: 11/20/20  5:43 PM  Result Value Ref Range   Neisseria Gonorrhea Negative    Chlamydia Negative    Trichomonas Negative    Bacterial Vaginitis (gardnerella) Positive (A)    Candida Vaginitis Positive (A)    Candida Glabrata Negative    Comment Normal Reference Range Candida Species - Negative    Comment Normal Reference Range Candida Galbrata - Negative    Comment Normal Reference Range Trichomonas - Negative    Comment      Normal Reference Range Bacterial Vaginosis - Negative   Comment Normal Reference [place] Chlamydia - Negative    Comment      Normal Reference Range Neisseria Gonorrhea - Negative  WET PREP FOR TRICH, YEAST, CLUE     Status: None   Collection Time: 01/15/21  3:05 PM  Result Value Ref Range   MICRO NUMBER: [id number]    Specimen Quality Adequate    SOURCE: NOT GIVEN    Status FINAL    RESULT      No Trichomonas vaginalis seen. No yeast seen No clue cells seen Epithelial Cells Present    No results found.     All questions at time of visit were answered - patient instructed to contact office with any additional concerns or updates. ER/RTC precautions were reviewed with the patient as applicable.   Please note: manual typing as well as voice recognition software may have been used to produce this document - typos may escape review. Please contact Dr. Sheppard Coil for any needed clarifications.

## 2021-01-15 NOTE — Patient Instructions (Signed)
Plan: Rx for clindamycin antibiotic If this isn't helping, please fill and use the Rx for Boric Acid capsules  If THAT isn't helping, call me!

## 2021-01-17 LAB — WET PREP FOR TRICH, YEAST, CLUE
MICRO NUMBER:: 11849224
Specimen Quality: ADEQUATE

## 2021-01-17 LAB — C. TRACHOMATIS/N. GONORRHOEAE RNA
C. trachomatis RNA, TMA: NOT DETECTED
N. gonorrhoeae RNA, TMA: NOT DETECTED

## 2021-01-31 ENCOUNTER — Other Ambulatory Visit: Payer: Self-pay

## 2021-01-31 DIAGNOSIS — B9689 Other specified bacterial agents as the cause of diseases classified elsewhere: Secondary | ICD-10-CM

## 2021-01-31 DIAGNOSIS — N760 Acute vaginitis: Secondary | ICD-10-CM

## 2021-01-31 MED ORDER — CLINDAMYCIN HCL 300 MG PO CAPS: 300.0000 mg | ORAL_CAPSULE | Freq: Three times a day (TID) | ORAL | 0 refills | Status: AC

## 2021-01-31 MED ORDER — CLINDAMYCIN HCL 300 MG PO CAPS: 300.0000 mg | ORAL_CAPSULE | Freq: Three times a day (TID) | ORAL | 0 refills | Status: DC

## 2021-01-31 NOTE — Progress Notes (Signed)
Pt stated she never received Rx. Pt states [facility] said they never got the prescription, I called [facility] and they confirmed this. Resent Rx.

## 2021-04-21 ENCOUNTER — Emergency Department: Admit: 2021-04-21 | Discharge status: Absent without leave | Payer: Self-pay
